# Patient Record
Sex: Male | Born: 1980 | Race: White | Hispanic: Yes | Marital: Married | Smoking: Former smoker
Health system: Southern US, Community
[De-identification: ages and names within clinical notes are randomized; demographics above are authoritative.]

## PROBLEM LIST (undated history)

## (undated) DIAGNOSIS — K922 Gastrointestinal hemorrhage, unspecified: Secondary | ICD-10-CM

## (undated) DIAGNOSIS — K219 Gastro-esophageal reflux disease without esophagitis: Secondary | ICD-10-CM

## (undated) HISTORY — DX: Gastro-esophageal reflux disease without esophagitis: K21.9

## (undated) HISTORY — PX: APPENDECTOMY: SHX54

## (undated) HISTORY — PX: TONSILLECTOMY: SUR1361

---
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
## 2005-10-24 ENCOUNTER — Emergency Department (HOSPITAL_COMMUNITY): Admission: EM | Admit: 2005-10-24 | Discharge: 2005-10-24 | Payer: Self-pay | Admitting: Emergency Medicine

## 2009-11-01 ENCOUNTER — Observation Stay (HOSPITAL_COMMUNITY): Admission: EM | Admit: 2009-11-01 | Discharge: 2009-11-02 | Payer: Self-pay | Admitting: Emergency Medicine

## 2010-07-07 LAB — URINE MICROSCOPIC-ADD ON

## 2010-07-07 LAB — COMPREHENSIVE METABOLIC PANEL
ALT: 23 U/L (ref 0–53)
Albumin: 4.3 g/dL (ref 3.5–5.2)
Calcium: 8.9 mg/dL (ref 8.4–10.5)
Creatinine, Ser: 0.75 mg/dL (ref 0.4–1.5)
GFR calc Af Amer: >60 mL/min (ref >60)
Glucose, Bld: 121 mg/dL — ABNORMAL HIGH (ref 70–99)
Potassium: 3.9 mEq/L (ref 3.5–5.1)
Total Protein: 7.6 g/dL (ref 6.0–8.3)

## 2010-07-07 LAB — CBC
HCT: 43.1 % (ref 39.0–52.0)
MCH: 27.7 pg (ref 26.0–34.0)
MCV: 82.3 fL (ref 78.0–100.0)
RBC: 5.24 MIL/uL (ref 4.22–5.81)
RDW: 13.2 % (ref 11.5–15.5)

## 2010-07-07 LAB — DIFFERENTIAL
Basophils Absolute: 0 10*3/uL (ref 0.0–0.1)
Lymphocytes Relative: 8 % — ABNORMAL LOW (ref 12–46)
Lymphs Abs: 1.1 10*3/uL (ref 0.7–4.0)
Neutro Abs: 12.3 10*3/uL — ABNORMAL HIGH (ref 1.7–7.7)
Neutrophils Relative %: 85 % — ABNORMAL HIGH (ref 43–77)

## 2010-07-07 LAB — URINALYSIS, ROUTINE W REFLEX MICROSCOPIC
Nitrite: NEGATIVE
Specific Gravity, Urine: 1.02 (ref 1.005–1.030)

## 2014-12-10 ENCOUNTER — Encounter (HOSPITAL_COMMUNITY): Payer: Self-pay | Admitting: *Deleted

## 2014-12-10 ENCOUNTER — Emergency Department (HOSPITAL_COMMUNITY): Payer: No Typology Code available for payment source

## 2014-12-10 ENCOUNTER — Emergency Department (HOSPITAL_COMMUNITY)
Admission: EM | Admit: 2014-12-10 | Discharge: 2014-12-10 | Disposition: A | Discharge status: Home / Self Care | Payer: No Typology Code available for payment source | Attending: Emergency Medicine | Admitting: Emergency Medicine

## 2014-12-10 DIAGNOSIS — Y998 Other external cause status: Secondary | ICD-10-CM | POA: Diagnosis not present

## 2014-12-10 DIAGNOSIS — S134XXA Sprain of ligaments of cervical spine, initial encounter: Secondary | ICD-10-CM

## 2014-12-10 DIAGNOSIS — S3991XA Unspecified injury of abdomen, initial encounter: Secondary | ICD-10-CM | POA: Insufficient documentation

## 2014-12-10 DIAGNOSIS — S199XXA Unspecified injury of neck, initial encounter: Secondary | ICD-10-CM | POA: Diagnosis present

## 2014-12-10 DIAGNOSIS — Y92410 Unspecified street and highway as the place of occurrence of the external cause: Secondary | ICD-10-CM | POA: Diagnosis not present

## 2014-12-10 DIAGNOSIS — S3992XA Unspecified injury of lower back, initial encounter: Secondary | ICD-10-CM | POA: Diagnosis not present

## 2014-12-10 DIAGNOSIS — Y9389 Activity, other specified: Secondary | ICD-10-CM | POA: Insufficient documentation

## 2014-12-10 LAB — I-STAT CHEM 8, ED
BUN: 14 mg/dL (ref 6–20)
CALCIUM ION: 1.18 mmol/L (ref 1.12–1.23)
CREATININE: 0.9 mg/dL (ref 0.61–1.24)
Chloride: 103 mmol/L (ref 101–111)
GLUCOSE: 98 mg/dL (ref 65–99)
HCT: 47 % (ref 39.0–52.0)
Hemoglobin: 16 g/dL (ref 13.0–17.0)
Potassium: 4 mmol/L (ref 3.5–5.1)
Sodium: 141 mmol/L (ref 135–145)
TCO2: 25 mmol/L (ref 0–100)

## 2014-12-10 MED ORDER — IBUPROFEN 800 MG PO TABS: 800.0000 mg | ORAL_TABLET | Freq: Four times a day (QID) | ORAL | Status: DC | PRN

## 2014-12-10 MED ORDER — FENTANYL CITRATE (PF) 100 MCG/2ML IJ SOLN: 50.0000 ug | INTRAMUSCULAR | Status: DC | PRN

## 2014-12-10 MED ORDER — IOHEXOL 300 MG/ML  SOLN
100.0000 mL | Freq: Once | INTRAMUSCULAR | Status: AC | PRN
  Administered 2014-12-10: 100 mL via INTRAVENOUS

## 2014-12-10 NOTE — ED Notes (Signed)
Pt alert & oriented x4, stable gait. Patient given discharge instructions, paperwork & prescription(s). Patient  instructed to stop at the registration desk to finish any additional paperwork. Patient verbalized understanding. Pt left department w/ no further questions. 

## 2014-12-10 NOTE — Discharge Instructions (Signed)
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
## 2014-12-10 NOTE — ED Notes (Signed)
Pt asked if wanted any pain medication he denied at this time. Was informed to let this nurse know if pain level increases to the point he wants some.

## 2018-10-20 DIAGNOSIS — B9681 Helicobacter pylori [H. pylori] as the cause of diseases classified elsewhere: Secondary | ICD-10-CM

## 2018-10-20 HISTORY — DX: Gastritis, unspecified, without bleeding: B96.81

## 2018-11-01 ENCOUNTER — Inpatient Hospital Stay (HOSPITAL_COMMUNITY): Payer: Self-pay | Admitting: Anesthesiology

## 2018-11-01 ENCOUNTER — Inpatient Hospital Stay (HOSPITAL_COMMUNITY)
Admission: EM | Admit: 2018-11-01 | Discharge: 2018-11-04 | DRG: 378 | Disposition: A | Discharge status: Home / Self Care | Payer: Self-pay | Attending: Internal Medicine | Admitting: Internal Medicine

## 2018-11-01 ENCOUNTER — Encounter (HOSPITAL_COMMUNITY): Payer: Self-pay | Admitting: *Deleted

## 2018-11-01 ENCOUNTER — Other Ambulatory Visit: Payer: Self-pay

## 2018-11-01 ENCOUNTER — Emergency Department (HOSPITAL_COMMUNITY): Payer: Self-pay

## 2018-11-01 ENCOUNTER — Encounter (HOSPITAL_COMMUNITY)
Admission: EM | Disposition: A | Discharge status: Home / Self Care | Payer: Self-pay | Source: Home / Self Care | Attending: Internal Medicine

## 2018-11-01 DIAGNOSIS — K264 Chronic or unspecified duodenal ulcer with hemorrhage: Principal | ICD-10-CM | POA: Diagnosis present

## 2018-11-01 DIAGNOSIS — R944 Abnormal results of kidney function studies: Secondary | ICD-10-CM | POA: Diagnosis present

## 2018-11-01 DIAGNOSIS — D649 Anemia, unspecified: Secondary | ICD-10-CM | POA: Diagnosis present

## 2018-11-01 DIAGNOSIS — T39395A Adverse effect of other nonsteroidal anti-inflammatory drugs [NSAID], initial encounter: Secondary | ICD-10-CM | POA: Diagnosis present

## 2018-11-01 DIAGNOSIS — K2970 Gastritis, unspecified, without bleeding: Secondary | ICD-10-CM

## 2018-11-01 DIAGNOSIS — K449 Diaphragmatic hernia without obstruction or gangrene: Secondary | ICD-10-CM | POA: Diagnosis present

## 2018-11-01 DIAGNOSIS — K222 Esophageal obstruction: Secondary | ICD-10-CM | POA: Diagnosis present

## 2018-11-01 DIAGNOSIS — M545 Low back pain: Secondary | ICD-10-CM | POA: Diagnosis present

## 2018-11-01 DIAGNOSIS — F1010 Alcohol abuse, uncomplicated: Secondary | ICD-10-CM | POA: Diagnosis present

## 2018-11-01 DIAGNOSIS — Z791 Long term (current) use of non-steroidal anti-inflammatories (NSAID): Secondary | ICD-10-CM

## 2018-11-01 DIAGNOSIS — Z9049 Acquired absence of other specified parts of digestive tract: Secondary | ICD-10-CM

## 2018-11-01 DIAGNOSIS — Z20828 Contact with and (suspected) exposure to other viral communicable diseases: Secondary | ICD-10-CM | POA: Diagnosis present

## 2018-11-01 DIAGNOSIS — R350 Frequency of micturition: Secondary | ICD-10-CM | POA: Diagnosis present

## 2018-11-01 DIAGNOSIS — D519 Vitamin B12 deficiency anemia, unspecified: Secondary | ICD-10-CM | POA: Diagnosis present

## 2018-11-01 DIAGNOSIS — K922 Gastrointestinal hemorrhage, unspecified: Secondary | ICD-10-CM

## 2018-11-01 DIAGNOSIS — E876 Hypokalemia: Secondary | ICD-10-CM | POA: Diagnosis present

## 2018-11-01 DIAGNOSIS — D62 Acute posthemorrhagic anemia: Secondary | ICD-10-CM

## 2018-11-01 DIAGNOSIS — K2971 Gastritis, unspecified, with bleeding: Secondary | ICD-10-CM | POA: Diagnosis present

## 2018-11-01 DIAGNOSIS — B9681 Helicobacter pylori [H. pylori] as the cause of diseases classified elsewhere: Secondary | ICD-10-CM

## 2018-11-01 DIAGNOSIS — K921 Melena: Secondary | ICD-10-CM | POA: Diagnosis present

## 2018-11-01 DIAGNOSIS — R0602 Shortness of breath: Secondary | ICD-10-CM

## 2018-11-01 HISTORY — PX: ESOPHAGOGASTRODUODENOSCOPY (EGD) WITH PROPOFOL: SHX5813

## 2018-11-01 HISTORY — PX: BIOPSY: SHX5522

## 2018-11-01 HISTORY — DX: Gastrointestinal hemorrhage, unspecified: K92.2

## 2018-11-01 LAB — CBC WITH DIFFERENTIAL/PLATELET
Abs Immature Granulocytes: 0.18 10*3/uL — ABNORMAL HIGH (ref 0.00–0.07)
Abs Immature Granulocytes: 0.12 10*3/uL — ABNORMAL HIGH (ref 0.00–0.07)
Basophils Absolute: 0 10*3/uL (ref 0.0–0.1)
Basophils Absolute: 0.1 10*3/uL (ref 0.0–0.1)
Basophils Relative: 0 %
Basophils Relative: 1 %
Eosinophils Absolute: 0 10*3/uL (ref 0.0–0.5)
Eosinophils Absolute: 0.1 10*3/uL (ref 0.0–0.5)
Eosinophils Relative: 0 %
Eosinophils Relative: 1 %
HCT: 18.2 % — ABNORMAL LOW (ref 39.0–52.0)
HCT: 27.2 % — ABNORMAL LOW (ref 39.0–52.0)
Hemoglobin: 5.9 g/dL — CL (ref 13.0–17.0)
Hemoglobin: 8.6 g/dL — ABNORMAL LOW (ref 13.0–17.0)
Immature Granulocytes: 2 %
Immature Granulocytes: 1 %
Lymphocytes Relative: 30 %
Lymphocytes Relative: 25 %
Lymphs Abs: 3.7 10*3/uL (ref 0.7–4.0)
Lymphs Abs: 2.7 10*3/uL (ref 0.7–4.0)
MCH: 27.8 pg (ref 26.0–34.0)
MCH: 28.6 pg (ref 26.0–34.0)
MCHC: 32.4 g/dL (ref 30.0–36.0)
MCHC: 31.6 g/dL (ref 30.0–36.0)
MCV: 85.8 fL (ref 80.0–100.0)
MCV: 90.4 fL (ref 80.0–100.0)
Monocytes Absolute: 0.8 10*3/uL (ref 0.1–1.0)
Monocytes Absolute: 0.7 10*3/uL (ref 0.1–1.0)
Monocytes Relative: 6 %
Monocytes Relative: 7 %
Neutro Abs: 7.7 10*3/uL (ref 1.7–7.7)
Neutro Abs: 7.2 10*3/uL (ref 1.7–7.7)
Neutrophils Relative %: 62 %
Neutrophils Relative %: 65 %
Platelets: 292 10*3/uL (ref 150–400)
Platelets: 247 10*3/uL (ref 150–400)
RBC: 2.12 MIL/uL — ABNORMAL LOW (ref 4.22–5.81)
RBC: 3.01 MIL/uL — ABNORMAL LOW (ref 4.22–5.81)
RDW: 14.3 % (ref 11.5–15.5)
RDW: 16 % — ABNORMAL HIGH (ref 11.5–15.5)
WBC: 12.4 10*3/uL — ABNORMAL HIGH (ref 4.0–10.5)
WBC: 10.9 10*3/uL — ABNORMAL HIGH (ref 4.0–10.5)
nRBC: 1 % — ABNORMAL HIGH (ref 0.0–0.2)
nRBC: 0.7 % — ABNORMAL HIGH (ref 0.0–0.2)

## 2018-11-01 LAB — APTT: aPTT: 30 seconds (ref 24–36)

## 2018-11-01 LAB — SARS CORONAVIRUS 2 BY RT PCR (HOSPITAL ORDER, PERFORMED IN ~~LOC~~ HOSPITAL LAB): SARS Coronavirus 2: NEGATIVE

## 2018-11-01 LAB — IRON AND TIBC
Iron: 77 ug/dL (ref 45–182)
Saturation Ratios: 24 % (ref 17.9–39.5)
TIBC: 316 ug/dL (ref 250–450)
UIBC: 239 ug/dL

## 2018-11-01 LAB — COMPREHENSIVE METABOLIC PANEL
ALT: 40 U/L (ref 0–44)
AST: 21 U/L (ref 15–41)
Albumin: 3.1 g/dL — ABNORMAL LOW (ref 3.5–5.0)
Alkaline Phosphatase: 38 U/L (ref 38–126)
Anion gap: 5 (ref 5–15)
BUN: 41 mg/dL — ABNORMAL HIGH (ref 6–20)
CO2: 23 mmol/L (ref 22–32)
Calcium: 7.3 mg/dL — ABNORMAL LOW (ref 8.9–10.3)
Chloride: 107 mmol/L (ref 98–111)
Creatinine, Ser: 0.73 mg/dL (ref 0.61–1.24)
GFR calc Af Amer: >60 mL/min (ref >60)
GFR calc non Af Amer: >60 mL/min (ref >60)
Glucose, Bld: 157 mg/dL — ABNORMAL HIGH (ref 70–99)
Potassium: 3.3 mmol/L — ABNORMAL LOW (ref 3.5–5.1)
Sodium: 135 mmol/L (ref 135–145)
Total Bilirubin: 0.4 mg/dL (ref 0.3–1.2)
Total Protein: 5.8 g/dL — ABNORMAL LOW (ref 6.5–8.1)

## 2018-11-01 LAB — CBC
HCT: 20.2 % — ABNORMAL LOW (ref 39.0–52.0)
Hemoglobin: 6.5 g/dL — CL (ref 13.0–17.0)
MCH: 28 pg (ref 26.0–34.0)
MCHC: 32.2 g/dL (ref 30.0–36.0)
MCV: 87.1 fL (ref 80.0–100.0)
Platelets: 235 10*3/uL (ref 150–400)
RBC: 2.32 MIL/uL — ABNORMAL LOW (ref 4.22–5.81)
RDW: 14.2 % (ref 11.5–15.5)
WBC: 10.5 10*3/uL (ref 4.0–10.5)
nRBC: 0.9 % — ABNORMAL HIGH (ref 0.0–0.2)

## 2018-11-01 LAB — ABO/RH: ABO/RH(D): O POS

## 2018-11-01 LAB — HEMOGLOBIN AND HEMATOCRIT, BLOOD
HCT: 22.7 % — ABNORMAL LOW (ref 39.0–52.0)
Hemoglobin: 7.3 g/dL — ABNORMAL LOW (ref 13.0–17.0)

## 2018-11-01 LAB — VITAMIN B12: Vitamin B-12: 174 pg/mL — ABNORMAL LOW (ref 180–914)

## 2018-11-01 LAB — PROTIME-INR
INR: 1.2 (ref 0.8–1.2)
Prothrombin Time: 15.1 seconds (ref 11.4–15.2)

## 2018-11-01 LAB — RETICULOCYTES
Immature Retic Fract: 44.2 % — ABNORMAL HIGH (ref 2.3–15.9)
RBC.: 2.1 MIL/uL — ABNORMAL LOW (ref 4.22–5.81)
Retic Count, Absolute: 127.3 10*3/uL (ref 19.0–186.0)
Retic Ct Pct: 6.1 % — ABNORMAL HIGH (ref 0.4–3.1)

## 2018-11-01 LAB — POC OCCULT BLOOD, ED: Fecal Occult Bld: POSITIVE — AB

## 2018-11-01 LAB — PREPARE RBC (CROSSMATCH)

## 2018-11-01 LAB — FERRITIN: Ferritin: 39 ng/mL (ref 24–336)

## 2018-11-01 LAB — FOLATE: Folate: 7.8 ng/mL (ref >5.9)

## 2018-11-01 SURGERY — ESOPHAGOGASTRODUODENOSCOPY (EGD) WITH PROPOFOL
Anesthesia: General

## 2018-11-01 MED ORDER — SODIUM CHLORIDE 0.9 % IV SOLN
INTRAVENOUS | Status: DC
  Administered 2018-11-01 – 2018-11-03 (×3): via INTRAVENOUS

## 2018-11-01 MED ORDER — PROPOFOL 10 MG/ML IV BOLUS
INTRAVENOUS | Status: DC | PRN
  Administered 2018-11-01 (×3): 20 mg via INTRAVENOUS

## 2018-11-01 MED ORDER — SODIUM CHLORIDE 0.9 % IV SOLN: 10.0000 mL/h | Freq: Once | INTRAVENOUS | Status: DC

## 2018-11-01 MED ORDER — HYDROMORPHONE HCL 1 MG/ML IJ SOLN
0.5000 mg | INTRAMUSCULAR | Status: DC | PRN
  Administered 2018-11-01 (×2): 0.5 mg via INTRAVENOUS
  Filled 2018-11-01: qty 0.5

## 2018-11-01 MED ORDER — CYANOCOBALAMIN 1000 MCG/ML IJ SOLN
1000.0000 ug | Freq: Every day | INTRAMUSCULAR | Status: DC
  Administered 2018-11-01 – 2018-11-04 (×4): 1000 ug via INTRAMUSCULAR
  Filled 2018-11-01 (×4): qty 1

## 2018-11-01 MED ORDER — PROPOFOL 10 MG/ML IV BOLUS
INTRAVENOUS | Status: AC
  Filled 2018-11-01: qty 20

## 2018-11-01 MED ORDER — HYDROMORPHONE HCL 1 MG/ML IJ SOLN
INTRAMUSCULAR | Status: AC
  Filled 2018-11-01: qty 0.5

## 2018-11-01 MED ORDER — KETAMINE HCL 10 MG/ML IJ SOLN
INTRAMUSCULAR | Status: DC | PRN
  Administered 2018-11-01 (×2): 10 mg via INTRAVENOUS

## 2018-11-01 MED ORDER — PROPOFOL 500 MG/50ML IV EMUL
INTRAVENOUS | Status: DC | PRN
  Administered 2018-11-01: 150 ug/kg/min via INTRAVENOUS

## 2018-11-01 MED ORDER — LIDOCAINE VISCOUS HCL 2 % MT SOLN
OROMUCOSAL | Status: AC
  Filled 2018-11-01: qty 15

## 2018-11-01 MED ORDER — LIDOCAINE VISCOUS HCL 2 % MT SOLN: 10.0000 mL | Freq: Once | OROMUCOSAL | Status: DC

## 2018-11-01 MED ORDER — ACETAMINOPHEN 325 MG PO TABS
650.0000 mg | ORAL_TABLET | Freq: Four times a day (QID) | ORAL | Status: DC | PRN
  Administered 2018-11-01 – 2018-11-02 (×2): 650 mg via ORAL
  Filled 2018-11-01 (×2): qty 2

## 2018-11-01 MED ORDER — SODIUM CHLORIDE 0.9 % IV BOLUS
1000.0000 mL | Freq: Once | INTRAVENOUS | Status: AC
  Administered 2018-11-01: 1000 mL via INTRAVENOUS

## 2018-11-01 MED ORDER — SODIUM CHLORIDE 0.9 % IV SOLN
8.0000 mg/h | INTRAVENOUS | Status: AC
  Administered 2018-11-01 – 2018-11-04 (×7): 8 mg/h via INTRAVENOUS
  Filled 2018-11-01 (×8): qty 80

## 2018-11-01 MED ORDER — GLYCOPYRROLATE 0.2 MG/ML IJ SOLN
INTRAMUSCULAR | Status: AC
  Filled 2018-11-01: qty 1

## 2018-11-01 MED ORDER — SODIUM CHLORIDE 0.9 % IV SOLN
10.0000 mL/h | Freq: Once | INTRAVENOUS | Status: AC
  Administered 2018-11-01: 10 mL/h via INTRAVENOUS

## 2018-11-01 MED ORDER — SODIUM CHLORIDE (PF) 0.9 % IJ SOLN
PREFILLED_SYRINGE | INTRAMUSCULAR | Status: DC | PRN
  Administered 2018-11-01: 4 mL

## 2018-11-01 MED ORDER — PANTOPRAZOLE SODIUM 40 MG IV SOLR
INTRAVENOUS | Status: AC
  Filled 2018-11-01: qty 160

## 2018-11-01 MED ORDER — LACTATED RINGERS IV SOLN
INTRAVENOUS | Status: DC
  Administered 2018-11-01: 14:00:00 via INTRAVENOUS

## 2018-11-01 MED ORDER — SODIUM CHLORIDE 0.9 % IV SOLN
INTRAVENOUS | Status: DC
  Administered 2018-11-01: 15:00:00 via INTRAVENOUS

## 2018-11-01 MED ORDER — ONDANSETRON HCL 4 MG/2ML IJ SOLN
4.0000 mg | Freq: Once | INTRAMUSCULAR | Status: AC
  Administered 2018-11-01: 4 mg via INTRAVENOUS
  Filled 2018-11-01: qty 2

## 2018-11-01 MED ORDER — PROPOFOL 10 MG/ML IV BOLUS
INTRAVENOUS | Status: AC
  Filled 2018-11-01: qty 40

## 2018-11-01 MED ORDER — LIDOCAINE HCL (CARDIAC) PF 100 MG/5ML IV SOSY
PREFILLED_SYRINGE | INTRAVENOUS | Status: DC | PRN
  Administered 2018-11-01: 50 mg via INTRAVENOUS

## 2018-11-01 MED ORDER — MIDAZOLAM HCL 2 MG/2ML IJ SOLN
INTRAMUSCULAR | Status: AC
  Filled 2018-11-01: qty 2

## 2018-11-01 MED ORDER — SODIUM CHLORIDE 0.9 % IV SOLN
80.0000 mg | Freq: Once | INTRAVENOUS | Status: AC
  Administered 2018-11-01: 80 mg via INTRAVENOUS
  Filled 2018-11-01: qty 80

## 2018-11-01 MED ORDER — KETAMINE HCL 50 MG/5ML IJ SOSY
PREFILLED_SYRINGE | INTRAMUSCULAR | Status: AC
  Filled 2018-11-01: qty 5

## 2018-11-01 MED ORDER — SODIUM CHLORIDE 0.9 % IV SOLN: INTRAVENOUS | Status: DC

## 2018-11-01 MED ORDER — EPINEPHRINE 1 MG/10ML IJ SOSY
PREFILLED_SYRINGE | INTRAMUSCULAR | Status: AC
  Filled 2018-11-01: qty 10

## 2018-11-01 MED ORDER — LIDOCAINE VISCOUS HCL 2 % MT SOLN
OROMUCOSAL | Status: DC | PRN
  Administered 2018-11-01: 10 mL via OROMUCOSAL

## 2018-11-01 MED ORDER — GLYCOPYRROLATE 0.2 MG/ML IJ SOLN
INTRAMUSCULAR | Status: DC | PRN
  Administered 2018-11-01: 0.1 mg via INTRAVENOUS

## 2018-11-01 NOTE — Op Note (Signed)
[facility] Patient Name: Keith Hale Procedure Date: 11/01/2018 1:00 PM MRN: [id number] Date of Birth: July 21, 1980 Attending MD: Jonette EvaSandi Rayona Sardinha MD, MD CSN: [id number] Age: [age] Admit Type: Inpatient Procedure:                Upper GI endoscopy-COLD FORCEPS BIOPSY/SUBMUCOSAL                            INJ/HEMOSPRAY Indications:              Melena ON IBUPROFEN, DRINKS ETOH DAILY, AND NO PPI Providers:                Jonette EvaSandi Noele Icenhour MD, MD, Loma MessingLurae B. Patsy LagerAlbert RN, RN,                            Pandora LeiterNeville David, Technician Referring MD:              Medicines:                Propofol per Anesthesia Complications:            No immediate complications. Estimated Blood Loss:     Estimated blood loss was minimal. Procedure:                Pre-Anesthesia Assessment:                           - Prior to the procedure, a History and Physical                            was performed, and patient medications and                            allergies were reviewed. The patient's tolerance of                            previous anesthesia was also reviewed. The risks                            and benefits of the procedure and the sedation                            options and risks were discussed with the patient.                            All questions were answered, and informed consent                            was obtained. Prior Anticoagulants: The patient has                            taken no previous anticoagulant or antiplatelet                            agents except for NSAID medication. ASA Grade  Assessment: II - A patient with mild systemic                            disease. After reviewing the risks and benefits,                            the patient was deemed in satisfactory condition to                            undergo the procedure. After obtaining informed                            consent, the endoscope was passed under direct         vision. Throughout the procedure, the patient's                            blood pressure, pulse, and oxygen saturations were                            monitored continuously. The GIF-[id number] ([id number]                            scope was introduced through the mouth, and                            advanced to the second part of duodenum. The upper                            GI endoscopy was accomplished without difficulty.                            The patient tolerated the procedure well. Scope In: 2:07:31 PM Scope Out: 2:19:02 PM Total Procedure Duration: 0 hours 11 minutes 31 seconds  Findings:      A widely patent Schatzki ring was found at the gastroesophageal junction.      A medium-sized hiatal hernia was present.      Diffuse mild inflammation characterized by congestion (edema) and       erythema was found in the gastric body and in the gastric antrum.       Biopsies(2:BODY, 1: INCISURA, 2: ANTRUM) were taken with a cold forceps       for Helicobacter pylori testing.      One oozing cratered duodenal ulcer with a visible vessel/ADHERENT CLOT       was found in the duodenal bulb. Area was unsuccessfully injected with 4       mL of a 1:10,000 solution of epinephrine for hemostasis. HEMOSTASIS       ACHIEVED AFTER HEMOSPRAY APPLICATION      The second portion of the duodenum was normal. Impression:               - Widely patent Schatzki ring.                           - Medium-sized hiatal hernia.                           -  MILD Gastritis DUE TO NSAIDs. Biopsied.                           - GI BLEEDI DUE TO Duodenal ulcer with a visible                            vessel. Moderate Sedation:      Per Anesthesia Care Recommendation:           - Return patient to hospital ward for ongoing care.                           - Full liquid diet.                           - Continue present medications. PROTONIX GTT FOR 72                            HRS(STOP JUL 16@1400 ) THEN  TRANSITION TO PROTONIX                            BID.                           - Await pathology results.                           - Return to my office in 4 months. Procedure Code(s):        --- Professional ---                           43255, 59, Esophagogastroduodenoscopy, flexible,                            transoral; with control of bleeding, any method                           43239, Esophagogastroduodenoscopy, flexible,                            transoral; with biopsy, single or multiple Diagnosis Code(s):        --- Professional ---                           K22.2, Esophageal obstruction                           K44.9, Diaphragmatic hernia without obstruction or                            gangrene                           K29.70, Gastritis, unspecified, without bleeding                           K26.4, Chronic or unspecified duodenal ulcer with  hemorrhage                           K92.1, Melena (includes Hematochezia) CPT copyright 2019 [employer]. All rights reserved. The codes documented in this report are preliminary and upon coder review may  be revised to meet current compliance requirements. Barney Drain, MD Barney Drain MD, MD 11/01/2018 2:36:36 PM This report has been signed electronically. Number of Addenda: 0

## 2018-11-01 NOTE — Anesthesia Postprocedure Evaluation (Addendum)
Anesthesia Post Note  Patient: Keith Hale  Procedure(s) Performed: ESOPHAGOGASTRODUODENOSCOPY (EGD) WITH PROPOFOL (N/A ) BIOPSY  Patient location during evaluation: PACU Anesthesia Type: General Level of consciousness: awake and alert Pain management: pain level controlled Vital Signs Assessment: post-procedure vital signs reviewed and stable Respiratory status: spontaneous breathing Cardiovascular status: stable Postop Assessment: no apparent nausea or vomiting Anesthetic complications: no     Last Vitals:  Vitals:   11/01/18 1428 11/01/18 1430  BP: 135/80 129/83  Pulse: 87 85  Resp: (!) 31 (!) 34  Temp: 36.8 C   SpO2: 97% 96%    Last Pain:  Vitals:   11/01/18 1428  TempSrc:   PainSc: 2                  Everette Rank

## 2018-11-01 NOTE — Transfer of Care (Addendum)
Immediate Anesthesia Transfer of Care Note  Patient: Keith Hale  Procedure(s) Performed: ESOPHAGOGASTRODUODENOSCOPY (EGD) WITH PROPOFOL (N/A ) BIOPSY  Patient Location: PACU  Anesthesia Type:General  Level of Consciousness: awake, alert  and patient cooperative  Airway & Oxygen Therapy: Patient Spontanous Breathing  Post-op Assessment: Report given to RN and Post -op Vital signs reviewed and stable  Post vital signs: Reviewed and stable  Last Vitals:  Vitals Value Taken Time  BP 135/80 11/01/18 1428  Temp    Pulse 85 11/01/18 1429  Resp 34 11/01/18 1429  SpO2 96 % 11/01/18 1429  Vitals shown include unvalidated device data.  Last Pain:  Vitals:   11/01/18 1357  TempSrc:   PainSc: 0-No pain         Complications: No apparent anesthesia complications

## 2018-11-01 NOTE — ED Provider Notes (Signed)
[facility] EMERGENCY DEPARTMENT Provider Note   CSN: [id number] Arrival date & time: 11/01/18  0217     History   Chief Complaint Chief Complaint  Patient presents with  . Shortness of Breath    HPI Keith Hale is a 38 y.o. male.     Level 5 caveat for language barrier.  Patient does speak some [place].  Translator used.  Patient states he is felt short of breath with a racing heart and dizziness for the past 2 days.  He denies any chest pain, cough or fever.  States he feels lightheaded especially when he stands up and feels his heart racing and having some trouble breathing.  Admits to having dark stools for the past 6 days is been black in color and firm.  Denies any diarrhea.  No chest pain or abdominal pain. States he uses alcohol occasionally and NSAIDs occasionally.  Not on a regular basis.  No previous history of stomach problems other than appendectomy. No history of ulcers. Denies any chronic medical problems or regular medications.  The history is provided by the patient. The history is limited by a language barrier. A language interpreter was used.  Shortness of Breath Associated symptoms: no rash and no vomiting     History reviewed. No pertinent past medical history.  There are no active problems to display for this patient.   Past Surgical History:  Procedure Laterality Date  . APPENDECTOMY          Home Medications    Prior to Admission medications   Medication Sig Start Date End Date Taking? Authorizing Provider  ibuprofen (ADVIL,MOTRIN) 800 MG tablet Take 1 tablet (800 mg total) by mouth every 6 (six) hours as needed for moderate pain. 12/10/14   Lyndal PulleyKnott, Daniel, MD    Family History No family history on file.  Social History Social History   Tobacco Use  . Smoking status: Never Smoker  . Smokeless tobacco: Never Used  Substance Use Topics  . Alcohol use: Yes    Comment: occ  . Drug use: No     Allergies   Patient has no known  allergies.   Review of Systems Review of Systems  Constitutional: Positive for activity change and fatigue.  HENT: Negative for congestion.   Respiratory: Positive for shortness of breath.   Gastrointestinal: Positive for blood in stool. Negative for nausea and vomiting.  Genitourinary: Negative for dysuria.  Musculoskeletal: Negative for arthralgias, back pain and myalgias.  Skin: Negative for rash.  Neurological: Positive for dizziness, weakness and light-headedness.    all other systems are negative except as noted in the HPI and PMH.    Physical Exam Updated Vital Signs BP (!) 106/58   Pulse (!) 107   Temp 97.8 F (36.6 C) (Oral)   Resp (!) 26   SpO2 100%   Physical Exam Vitals signs and nursing note reviewed.  Constitutional:      General: He is not in acute distress.    Appearance: He is well-developed. He is ill-appearing.  HENT:     Head: Normocephalic and atraumatic.     Mouth/Throat:     Pharynx: No oropharyngeal exudate.  Eyes:     Conjunctiva/sclera: Conjunctivae normal.     Pupils: Pupils are equal, round, and reactive to light.     Comments: Pale conjunctiva  Neck:     Musculoskeletal: Normal range of motion and neck supple.     Comments: No meningismus. Cardiovascular:     Rate  and Rhythm: Regular rhythm. Tachycardia present.     Heart sounds: Normal heart sounds. No murmur.  Pulmonary:     Effort: Pulmonary effort is normal. No respiratory distress.     Breath sounds: Normal breath sounds.  Abdominal:     Palpations: Abdomen is soft.     Tenderness: There is no abdominal tenderness. There is no guarding or rebound.  Genitourinary:    Comments: Black stool on rectal exam, no hemorrhoids Musculoskeletal: Normal range of motion.        General: No tenderness.  Skin:    General: Skin is warm.     Capillary Refill: Capillary refill takes more than 3 seconds.     Coloration: Skin is pale.  Neurological:     General: No focal deficit present.      Mental Status: He is alert and oriented to person, place, and time. Mental status is at baseline.     Cranial Nerves: No cranial nerve deficit.     Motor: No abnormal muscle tone.     Coordination: Coordination normal.     Comments: No ataxia on finger to nose bilaterally. No pronator drift. 5/5 strength throughout. CN 2-12 intact.Equal grip strength. Sensation intact.   Psychiatric:        Behavior: Behavior normal.      ED Treatments / Results  Labs (all labs ordered are listed, but only abnormal results are displayed) Labs Reviewed  COMPREHENSIVE METABOLIC PANEL - Abnormal; Notable for the following components:      Result Value   Potassium 3.3 (*)    Glucose, Bld 157 (*)    BUN 41 (*)    Calcium 7.3 (*)    Total Protein 5.8 (*)    Albumin 3.1 (*)    All other components within normal limits  CBC WITH DIFFERENTIAL/PLATELET - Abnormal; Notable for the following components:   WBC 12.4 (*)    RBC 2.12 (*)    Hemoglobin 5.9 (*)    HCT 18.2 (*)    nRBC 1.0 (*)    Abs Immature Granulocytes 0.18 (*)    All other components within normal limits  POC OCCULT BLOOD, ED - Abnormal; Notable for the following components:   Fecal Occult Bld POSITIVE (*)    All other components within normal limits  SARS CORONAVIRUS 2 (HOSPITAL ORDER, [place])  PROTIME-INR  APTT  VITAMIN B12  FOLATE  IRON AND TIBC  FERRITIN  RETICULOCYTES  TYPE AND SCREEN  PREPARE RBC (CROSSMATCH)  ABO/RH    EKG EKG Interpretation  Date/Time:  Monday November 01 2018 02:28:08 EDT Ventricular Rate:  114 PR Interval:    QRS Duration: 84 QT Interval:  342 QTC Calculation: 471 R Axis:   92 Text Interpretation:  Sinus tachycardia Probable inferior infarct, old No previous ECGs available Confirmed by Ezequiel Essex [phone]) on 11/01/2018 2:49:00 AM   Radiology Dg Chest Port 1 View  Result Date: 11/01/2018 CLINICAL DATA:  Tachycardia and shortness of breath EXAM: PORTABLE CHEST 1  VIEW COMPARISON:  12/10/2014 FINDINGS: The heart size and mediastinal contours are within normal limits. Both lungs are clear. The visualized skeletal structures are unremarkable. IMPRESSION: No active disease. Electronically Signed   By: Donavan Foil M.D.   On: 11/01/2018 03:11    Procedures Procedures (including critical care time)  Medications Ordered in ED Medications  sodium chloride 0.9 % bolus 1,000 mL (has no administration in time range)    And  0.9 %  sodium  chloride infusion (has no administration in time range)  pantoprazole (PROTONIX) 80 mg in sodium chloride 0.9 % 100 mL IVPB (has no administration in time range)  pantoprazole (PROTONIX) 80 mg in sodium chloride 0.9 % 250 mL (0.32 mg/mL) infusion (has no administration in time range)  ondansetron (ZOFRAN) injection 4 mg (has no administration in time range)     Initial Impression / Assessment and Plan / ED Course  I have reviewed the triage vital signs and the nursing notes.  Pertinent labs & imaging results that were available during my care of the patient were reviewed by me and considered in my medical decision making (see chart for details).       Patient with palpitations and shortness of breath for the past several days.  Appears to be secondary to melena patient has on exam.  He was tachycardic and symptomatic with dizziness, shortness of breath and lightheadedness.  IV access established, blood sent for labs.  Start IV fluids and IV PPI.  Patient does agree to blood transfusion if necessary.  Patient improving started on IV fluids and IV PPI.  Blood bank order initiated as transfusion is anticipated.  Hemoglobin is 5.9.  Blood pressure 112/65. Red cell transfusion will be initiated.  Patient remains hemodynamically stable in the ED.  Heart rate has improved to the 100s.  Blood pressure 112/62. Blood transfusion in progress.  Continue IV PPI. Patient will need GI evaluation in the morning for EGD.   Admission to step down unit discussed with Dr. Lucianne MussKumar.  CRITICAL CARE Performed by: Glynn OctaveStephen Ayansh Feutz Total critical care time: 60 minutes Critical care time was exclusive of separately billable procedures and treating other patients. Critical care was necessary to treat or prevent imminent or life-threatening deterioration. Critical care was time spent personally by me on the following activities: development of treatment plan with patient and/or surrogate as well as nursing, discussions with consultants, evaluation of patient's response to treatment, examination of patient, obtaining history from patient or surrogate, ordering and performing treatments and interventions, ordering and review of laboratory studies, ordering and review of radiographic studies, pulse oximetry and re-evaluation of patient's condition.   Final Clinical Impressions(s) / ED Diagnoses   Final diagnoses:  SOB (shortness of breath)  Gastrointestinal hemorrhage with melena    ED Discharge Orders    None       Onnie Alatorre, Jeannett SeniorStephen, MD 11/01/18 0405

## 2018-11-01 NOTE — H&P (Signed)
History and Physical    Keith Hale KZS:[id number] DOB: 1981/03/18 DOA: 11/01/2018  PCP: Patient, No Pcp Per (Confirm with patient/family/NH records and if not entered, this has to be entered at [facility] point of entry) Patient coming from: Home  I have personally briefly reviewed patient's old medical records in [place]  Chief Complaint: Shortness of breath   HPI: Keith Hale is a 38 y.o. male with no known significant past medical history presented to ED for racing heart/palpitaton and exertional SOB.  As per patient he was in his usual state of health 6 days ago when he noticed dark-colored stools.  For the past 2 days patient has been having palpitation with racing heart and exertional shortness of breath with dizziness.  As the symptoms progressively got worse he decided to come to the ED for further evaluation.  At the time of my interview patient endorses to using ibuprofen 4 pills a day and aspirin 2 to 4 pills a day for the past 5 to 10 days for his lower back pain.  No use of alcohol.  No prior history of ulcers, heartburn.  Endorses to remote history of appendectomy. No frank blood per rectum,  No complaint of chest pain, headache, blurring of vision, abdominal pain dysuria and diarrhea.  No complaint of nausea, anosmia, fever, chills.  No sick contacts or travel history.  Has been unemployed.  ED Course: In the ED patient was hemodynamically stable with low normal blood pressure, tachypneic tachycardic heart rates in 90s, saturating 100% on room air afebrile with T-max of 99.1.  Labs with mild leukocytosis 12.4 with hemoglobin 5.9/18.2 with reticulocytosis 44.2 coagulative studies within normal limit, lites with mild hypokalemia 3.3 BUN 41 creatinine 0.73 calcium 7.3.  Iron studies within normal limit.  Chest x-ray unremarkable.  Patient was given 2 units of blood in the ED.  Follow-up labs pending  Review of Systems: As per HPI all other systems reviewed and negative.  History  reviewed. No pertinent past medical history.  Past Surgical History:  Procedure Laterality Date  . APPENDECTOMY       reports that he has never smoked. He has never used smokeless tobacco. He reports current alcohol use. He reports that he does not use drugs.  No Known Allergies  No family history on file.  Acceptable: Family history reviewed and not pertinent (If you reviewed it)  Prior to Admission medications   Medication Sig Start Date End Date Taking? Authorizing Provider  ibuprofen (ADVIL,MOTRIN) 800 MG tablet Take 1 tablet (800 mg total) by mouth every 6 (six) hours as needed for moderate pain. 12/10/14   Leo Grosser, MD    Physical Exam: Vitals:   11/01/18 0309 11/01/18 0313 11/01/18 0330 11/01/18 0349  BP: 112/65  112/62 107/60  Pulse: (!) 106  (!) 101 96  Resp: (!) 25  (!) 22 (!) 23  Temp:  99.1 F (37.3 C)    TempSrc:  Rectal    SpO2: 99%  100% 100%    Constitutional: NAD, calm, comfortable Vitals:   11/01/18 0309 11/01/18 0313 11/01/18 0330 11/01/18 0349  BP: 112/65  112/62 107/60  Pulse: (!) 106  (!) 101 96  Resp: (!) 25  (!) 22 (!) 23  Temp:  99.1 F (37.3 C)    TempSrc:  Rectal    SpO2: 99%  100% 100%    Eyes: PERRL, lids and conjunctival pallor present  ENMT: Mucous membranes are moist. Posterior pharynx clear of any exudate  or lesions.Normal dentition.  Neck: normal, supple, no masses, no thyromegaly Respiratory: clear to auscultation bilaterally, no wheezing, no crackles. Normal respiratory effort. No accessory muscle use.  Cardiovascular: Regular rate and rhythm, no murmurs / rubs / gallops. No extremity edema. 2+ pedal pulses. No carotid bruits.  Abdomen: no tenderness, no masses palpated. No hepatosplenomegaly. Bowel sounds positive.  Musculoskeletal: no clubbing / cyanosis. No joint deformity upper and lower extremities. Good ROM, no contractures. Normal muscle tone.  Skin: no rashes, lesions, ulcers. No induration Neurologic: CN 2-12 grossly  intact. Sensation intact, DTR normal. Strength 5/5 in all 4.  Psychiatric: Normal judgment and insight. Alert and oriented x 3. Normal mood.   Labs on Admission: I have personally reviewed following labs and imaging studies  CBC: Recent Labs  Lab 11/01/18 0248  WBC 12.4*  NEUTROABS 7.7  HGB 5.9*  HCT 18.2*  MCV 85.8  PLT 292   Basic Metabolic Panel: Recent Labs  Lab 11/01/18 0248  NA 135  K 3.3*  CL 107  CO2 23  GLUCOSE 157*  BUN 41*  CREATININE 0.73  CALCIUM 7.3*   GFR: CrCl cannot be calculated (Unknown ideal weight.). Liver Function Tests: Recent Labs  Lab 11/01/18 0248  AST 21  ALT 40  ALKPHOS 38  BILITOT 0.4  PROT 5.8*  ALBUMIN 3.1*   No results for input(s): LIPASE, AMYLASE in the last 168 hours. No results for input(s): AMMONIA in the last 168 hours. Coagulation Profile: Recent Labs  Lab 11/01/18 0248  INR 1.2   Cardiac Enzymes: No results for input(s): CKTOTAL, CKMB, CKMBINDEX, TROPONINI in the last 168 hours. BNP (last 3 results) No results for input(s): PROBNP in the last 8760 hours. HbA1C: No results for input(s): HGBA1C in the last 72 hours. CBG: No results for input(s): GLUCAP in the last 168 hours. Lipid Profile: No results for input(s): CHOL, HDL, LDLCALC, TRIG, CHOLHDL, LDLDIRECT in the last 72 hours. Thyroid Function Tests: No results for input(s): TSH, T4TOTAL, FREET4, T3FREE, THYROIDAB in the last 72 hours. Anemia Panel: No results for input(s): VITAMINB12, FOLATE, FERRITIN, TIBC, IRON, RETICCTPCT in the last 72 hours. Urine analysis:    Component Value Date/Time   COLORURINE YELLOW 11/01/2009 0630   APPEARANCEUR CLEAR 11/01/2009 0630   LABSPEC 1.020 11/01/2009 0630   PHURINE 7.5 11/01/2009 0630   GLUCOSEU NEGATIVE 11/01/2009 0630   HGBUR TRACE (A) 11/01/2009 0630   BILIRUBINUR NEGATIVE 11/01/2009 0630   KETONESUR 15 (A) 11/01/2009 0630   PROTEINUR NEGATIVE 11/01/2009 0630   UROBILINOGEN 0.2 11/01/2009 0630   NITRITE  NEGATIVE 11/01/2009 0630   LEUKOCYTESUR NEGATIVE 11/01/2009 0630    Radiological Exams on Admission: Dg Chest Port 1 View  Result Date: 11/01/2018 CLINICAL DATA:  Tachycardia and shortness of breath EXAM: PORTABLE CHEST 1 VIEW COMPARISON:  12/10/2014 FINDINGS: The heart size and mediastinal contours are within normal limits. Both lungs are clear. The visualized skeletal structures are unremarkable. IMPRESSION: No active disease. Electronically Signed   By: Jasmine PangKim  Fujinaga M.D.   On: 11/01/2018 03:11    EKG: Independently reviewed. Sinus tachycardia with no acute changes.   Assessment/Plan Principal Problem:  GI bleed due to NSAIDs/Melena :  : Presented with exertional dyspnea and palpitations racing heart and dark melanotic stool of 5 days duration was found to have severe anemia with hemoglobin 5.9 with fecal occult blood positive.  Labs with elevated BUN to creatinine ratio.  Patient with possible upper GI bleed.   -We will keep the patient n.p.o. in  anticipation of EGD in the morning -Patient on PPI drip in the ED continue the same -Continue to monitor hemoglobin with every 6 hours CBC -Monitor the patient on telemetry as per the time of presentation patient was sinus tachycardia. -GI consult placed in the epic -Avoid NSAIDs and aspirin in the setting of possible upper GI bleed  Severe anemia : No prior history likely from acute blood loss, iron studies have been normal.  -Patient received 2 units of blood in the ED -We will monitor with CBC every 6 hours.    DVT prophylaxis: SCD Code Status: Full code  Family Communication: By bed side wife  Disposition Plan: Home  Consults called: GI consulted  Admission status: Inpatient   I certify that at the point of admission it is my clinical judgment that the patient will require inpatient hospital care spanning beyond 2 midnights from the point of admission due to high intensity of service, high risk for further deterioration and high  frequency of surveillance required. The following factors support the patient status of inpatient:    Kirstie PeriNavneet Mohanad Carsten MD [facility]  11/01/2018, 4:03 AM

## 2018-11-01 NOTE — Anesthesia Preprocedure Evaluation (Addendum)
Anesthesia Evaluation  Patient identified by MRN, date of birth, ID band Patient awake    Reviewed: Allergy & Precautions, NPO status , reviewed documented beta blocker date and time   Airway        Dental   Pulmonary           Cardiovascular      Neuro/Psych    GI/Hepatic   Endo/Other    Renal/GU      Musculoskeletal   Abdominal   Peds  Hematology   Anesthesia Other Findings   Reproductive/Obstetrics                             Anesthesia Physical Anesthesia Plan  ASA: II and emergent  Anesthesia Plan: General   Post-op Pain Management:    Induction:   PONV Risk Score and Plan: 2 and TIVA  Airway Management Planned:   Additional Equipment:   Intra-op Plan:   Post-operative Plan:   Informed Consent: I have reviewed the patients History and Physical, chart, labs and discussed the procedure including the risks, benefits and alternatives for the proposed anesthesia with the patient or authorized representative who has indicated his/her understanding and acceptance.       Plan Discussed with: CRNA  Anesthesia Plan Comments:        Anesthesia Quick Evaluation

## 2018-11-01 NOTE — H&P (View-Only) (Signed)
Referring Provider: [facility] Primary Care Physician:  Patient, No Pcp Per Primary Gastroenterologist:  Dr. Oneida Alar  Date of Admission: 11/01/18 Date of Consultation: 11/01/18  Reason for Consultation:  Melena, heme positive stool, anemia  HPI:  Keith Hale is a 38 y.o. year old male who presented to the ED with shortness of breath, racing heart, and dizziness for 2 day also with black stools for 6 days.   ED Course: Patient remained hemodynamically stable. HR in the 90s. Heme positive stool. Hemoglobin 5.9, WBC 12.4, INR 1.2, BUN 41, potassium 3.3, iron, TIBC, and ferritin within normal limits. Patient received 1 unit PRBC in ED.  Second unit of blood was just hung prior to me seeing the patient.   Patient states about 6-7 days ago he started having hard black stools. He also started noticing over the last few days his heart was beating fast, was feeling tired, and actually fell at his house yesterday due to feeling dizzy and lightheaded. Denies losing consciousness or hitting his head. States he has never had black stools before. Last BM was Saturday. Prior to 7 days ago, patient had normal formed stools.  No history of PUD. No epigastric pain or other abdominal pain, reflux symptoms, nausea or vomiting. No hematochezia. Patient admits to using ibuprofen about 6 times a week for low back pain. About 2 weeks ago he was taking back and body pain relief powders twice a day for 1 week. Also took 2 aspirin yesterday.  Admits to alcohol use occasionally. Last drank 8 beers on Saturday. States this is about how much he will drink every 2-3 weeks on the weekend. No drug use or tobacco use.   Admits to some chest discomfort when his heart was beating fast, no chest pain at this time. Denies shortness of breath, fever, chills, change in appetite, weight loss. Admits to increase in urinary frequency, denies burning with urination, fowl smell, or hematuria.   No family history of colon cancer  or colon polyps.     Patient does have a language barrier, but he does communicate fairly well without translator.   History reviewed. No pertinent past medical history.  Past Surgical History:  Procedure Laterality Date  . APPENDECTOMY      Prior to Admission medications   Medication Sig Start Date End Date Taking? Authorizing Provider  ibuprofen (ADVIL,MOTRIN) 800 MG tablet Take 1 tablet (800 mg total) by mouth every 6 (six) hours as needed for moderate pain. 12/10/14   Leo Grosser, MD    Current Facility-Administered Medications  Medication Dose Route Frequency Provider Last Rate Last Dose  . 0.9 %  sodium chloride infusion   Intravenous Continuous Oran Rein, MD      . 0.9 %  sodium chloride infusion  10 mL/hr Intravenous Once Oran Rein, MD      . 0.9 %  sodium chloride infusion   Intravenous Continuous Oran Rein, MD 75 mL/hr at 11/01/18 0555    . cyanocobalamin ((VITAMIN B-12)) injection 1,000 mcg  1,000 mcg Intramuscular Daily Barton Dubois, MD      . pantoprazole (PROTONIX) 80 mg in sodium chloride 0.9 % 250 mL (0.32 mg/mL) infusion  8 mg/hr Intravenous Continuous Oran Rein, MD 25 mL/hr at 11/01/18 0347 8 mg/hr at 11/01/18 0347    Allergies as of 11/01/2018  . (No Known Allergies)    No family history on file.  Social History   Socioeconomic History  . Marital status: Married    Spouse name:  Not on file  . Number of children: Not on file  . Years of education: Not on file  . Highest education level: Not on file  Occupational History  . Not on file  Social Needs  . Financial resource strain: Not on file  . Food insecurity    Worry: Not on file    Inability: Not on file  . Transportation needs    Medical: Not on file    Non-medical: Not on file  Tobacco Use  . Smoking status: Never Smoker  . Smokeless tobacco: Never Used  Substance and Sexual Activity  . Alcohol use: Yes    Comment: occ  . Drug use: No  . Sexual activity: Not on file   Lifestyle  . Physical activity    Days per week: Not on file    Minutes per session: Not on file  . Stress: Not on file  Relationships  . Social Musicianconnections    Talks on phone: Not on file    Gets together: Not on file    Attends religious service: Not on file    Active member of club or organization: Not on file    Attends meetings of clubs or organizations: Not on file    Relationship status: Not on file  . Intimate partner violence    Fear of current or ex partner: Not on file    Emotionally abused: Not on file    Physically abused: Not on file    Forced sexual activity: Not on file  Other Topics Concern  . Not on file  Social History Narrative  . Not on file    Review of Systems: Gen: See HPI CV: See HPI Resp: Denies cough GI: See HPI GU : See HPI  MS: Admits to lower back pain, nonradiating.  Derm: Denies rash, itching, dry skin  Psych: Denies depression, anxiety Heme: Denies other bruising, bleeding  Physical Exam: Vital signs in last 24 hours: Temp:  [97.8 F (36.6 C)-99.1 F (37.3 C)] 98.4 F (36.9 C) (07/13 0816) Pulse Rate:  [77-115] 82 (07/13 0816) Resp:  [16-26] 16 (07/13 0816) BP: (103-120)/(58-72) 104/61 (07/13 0816) SpO2:  [95 %-100 %] 100 % (07/13 0816) Weight:  [88.3 kg] 88.3 kg (07/13 0523) Last BM Date: 11/01/18 General:   Alert,  Well-developed, well-nourished, pleasant and cooperative in NAD Head:  Normocephalic and atraumatic. Eyes:  Sclera clear, no icterus.   Conjunctiva somewhat pale. Ears:  Normal auditory acuity. Nose:  No deformity, discharge,  or lesions. Mouth:  No deformity or lesions, dentition normal. Lungs:  Clear throughout to auscultation.   No wheezes, crackles, or rhonchi. No acute distress. Heart:  Regular rate and rhythm; no murmurs, clicks, rubs,  or gallops. Abdomen:  Soft, nontender and nondistended. No masses, hepatosplenomegaly or hernias noted. Normal bowel sounds, without guarding, and without rebound.   Rectal:   Deferred Msk:  Symmetrical without gross deformities. Normal posture. Negative for CVA tenderness.  Pulses:  Normal pulses noted. Extremities:  Without clubbing or edema. Neurologic:  Alert and  oriented x4;  grossly normal neurologically. Skin:  Intact without significant lesions or rashes. Psych:  Alert and cooperative. Normal mood and affect.  Intake/Output from previous day: 07/12 0701 - 07/13 0700 In: 665 [I.V.:250; Blood:315; IV Piggyback:100] Out: -   Lab Results: Recent Labs    11/01/18 0248 11/01/18 0622  WBC 12.4* 10.5  HGB 5.9* 6.5*  HCT 18.2* 20.2*  PLT 292 235   BMET Recent Labs  11/01/18 0248  NA 135  K 3.3*  CL 107  CO2 23  GLUCOSE 157*  BUN 41*  CREATININE 0.73  CALCIUM 7.3*   LFT Recent Labs    11/01/18 0248  PROT 5.8*  ALBUMIN 3.1*  AST 21  ALT 40  ALKPHOS 38  BILITOT 0.4   PT/INR Recent Labs    11/01/18 0248  LABPROT 15.1  INR 1.2   Hepatitis Panel No results for input(s): HEPBSAG, HCVAB, HEPAIGM, HEPBIGM in the last 72 hours. C-Diff No results for input(s): CDIFFTOX in the last 72 hours.  Studies/Results: Dg Chest Port 1 View  Result Date: 11/01/2018 CLINICAL DATA:  Tachycardia and shortness of breath EXAM: PORTABLE CHEST 1 VIEW COMPARISON:  12/10/2014 FINDINGS: The heart size and mediastinal contours are within normal limits. Both lungs are clear. The visualized skeletal structures are unremarkable. IMPRESSION: No active disease. Electronically Signed   By: Jasmine PangKim  Fujinaga M.D.   On: 11/01/2018 03:11    Impression: 38 y.o. male with no significant past medical history who presented to the ED with palpitations, fatigue, dizziness, and melena for the last 6 days in the setting of NSAID use. No blood thinners. Found to have hemoglobin 5.9 and BUN 41 in ED. Stool heme positive. INR 1.2. Iron and ferritin normal. B12 slightly low. B12 repletion has been started by hospitalists. Hemoglobin 6.5 after 1 unit PRBC, hemoglobin responding  normally. Receiving a second unit of PRBC at this time. Patient has been NPO since 5am. Last black stool Saturday. Feeling constipated. No other upper or lower GI symptoms. Suspect upper GI bleed secondary to NSAID use. Patient will need EGD to evaluate for source of bleeding.  Differentials include NSAID induced PUD, gastritis, esophagitis.   Patient also with low back pain and reporting some increase in urinary frequency. No other urinary symptoms. No CVA tenderness on exam. WBC normal today.   Plan: Follow-up on H/H after PRBC administration.  EGD with Dr. Darrick PennaFields. Will discuss timing and anesthesia with Dr. Darrick PennaFields. I have discussed risk and benefits of the procedure with the patient. He stated his understanding and agrees to proceed.   Continue NPO status until timing of EGD is determined.  Continue PPI infusion.  Management of lower back pain and urinary frequency per hospitalists.   LOS: 0 days    11/01/2018, 8:19 AM   Ermalinda MemosKristen Sincerity Cedar, PA-C [facility]

## 2018-11-01 NOTE — Interval H&P Note (Signed)
History and Physical Interval Note:  11/01/2018 2:03 PM  Keith Hale  has presented today for surgery, with the diagnosis of Melena, heme positive stool, acute blood loss anemia.  The various methods of treatment have been discussed with the patient and family. After consideration of risks, benefits and other options for treatment, the patient has consented to  Procedure(s): ESOPHAGOGASTRODUODENOSCOPY (EGD) WITH PROPOFOL (N/A) as a surgical intervention.  The patient's history has been reviewed, patient examined, no change in status, stable for surgery.  I have reviewed the patient's chart and labs.  Questions were answered to the patient's satisfaction.     [employer]

## 2018-11-01 NOTE — Progress Notes (Signed)
Patient seen and examined. Admitted after midnight secondary to acute blood loss anemia in the setting of upper GIB. Stable vital signs currently. Positive FOBT and waiting for EGD hopefully later today. Will continue IV protonix, start B12 repletion (found also to be low) and continue PRBC transfusion as need it. Please refer to H&P written by Dr. Dwyane Dee for further info/details on admission.   Barton Dubois MD [phone]

## 2018-11-01 NOTE — Consult Note (Addendum)
Referring Provider: [facility] Primary Care Physician:  Patient, No Pcp Per Primary Gastroenterologist:  Dr. Oneida Alar  Date of Admission: 11/01/18 Date of Consultation: 11/01/18  Reason for Consultation:  Melena, heme positive stool, anemia  HPI:  Keith Hale is a 38 y.o. year old male who presented to the ED with shortness of breath, racing heart, and dizziness for 2 day also with black stools for 6 days.   ED Course: Patient remained hemodynamically stable. HR in the 90s. Heme positive stool. Hemoglobin 5.9, WBC 12.4, INR 1.2, BUN 41, potassium 3.3, iron, TIBC, and ferritin within normal limits. Patient received 1 unit PRBC in ED.  Second unit of blood was just hung prior to me seeing the patient.   Patient states about 6-7 days ago he started having hard black stools. He also started noticing over the last few days his heart was beating fast, was feeling tired, and actually fell at his house yesterday due to feeling dizzy and lightheaded. Denies losing consciousness or hitting his head. States he has never had black stools before. Last BM was Saturday. Prior to 7 days ago, patient had normal formed stools.  No history of PUD. No epigastric pain or other abdominal pain, reflux symptoms, nausea or vomiting. No hematochezia. Patient admits to using ibuprofen about 6 times a week for low back pain. About 2 weeks ago he was taking back and body pain relief powders twice a day for 1 week. Also took 2 aspirin yesterday.  Admits to alcohol use occasionally. Last drank 8 beers on Saturday. States this is about how much he will drink every 2-3 weeks on the weekend. No drug use or tobacco use.   Admits to some chest discomfort when his heart was beating fast, no chest pain at this time. Denies shortness of breath, fever, chills, change in appetite, weight loss. Admits to increase in urinary frequency, denies burning with urination, fowl smell, or hematuria.   No family history of colon cancer  or colon polyps.     Patient does have a language barrier, but he does communicate fairly well without translator.   History reviewed. No pertinent past medical history.  Past Surgical History:  Procedure Laterality Date  . APPENDECTOMY      Prior to Admission medications   Medication Sig Start Date End Date Taking? Authorizing Provider  ibuprofen (ADVIL,MOTRIN) 800 MG tablet Take 1 tablet (800 mg total) by mouth every 6 (six) hours as needed for moderate pain. 12/10/14   Leo Grosser, MD    Current Facility-Administered Medications  Medication Dose Route Frequency Provider Last Rate Last Dose  . 0.9 %  sodium chloride infusion   Intravenous Continuous Oran Rein, MD      . 0.9 %  sodium chloride infusion  10 mL/hr Intravenous Once Oran Rein, MD      . 0.9 %  sodium chloride infusion   Intravenous Continuous Oran Rein, MD 75 mL/hr at 11/01/18 0555    . cyanocobalamin ((VITAMIN B-12)) injection 1,000 mcg  1,000 mcg Intramuscular Daily Barton Dubois, MD      . pantoprazole (PROTONIX) 80 mg in sodium chloride 0.9 % 250 mL (0.32 mg/mL) infusion  8 mg/hr Intravenous Continuous Oran Rein, MD 25 mL/hr at 11/01/18 0347 8 mg/hr at 11/01/18 0347    Allergies as of 11/01/2018  . (No Known Allergies)    No family history on file.  Social History   Socioeconomic History  . Marital status: Married    Spouse name:  Not on file  . Number of children: Not on file  . Years of education: Not on file  . Highest education level: Not on file  Occupational History  . Not on file  Social Needs  . Financial resource strain: Not on file  . Food insecurity    Worry: Not on file    Inability: Not on file  . Transportation needs    Medical: Not on file    Non-medical: Not on file  Tobacco Use  . Smoking status: Never Smoker  . Smokeless tobacco: Never Used  Substance and Sexual Activity  . Alcohol use: Yes    Comment: occ  . Drug use: No  . Sexual activity: Not on file   Lifestyle  . Physical activity    Days per week: Not on file    Minutes per session: Not on file  . Stress: Not on file  Relationships  . Social Musicianconnections    Talks on phone: Not on file    Gets together: Not on file    Attends religious service: Not on file    Active member of club or organization: Not on file    Attends meetings of clubs or organizations: Not on file    Relationship status: Not on file  . Intimate partner violence    Fear of current or ex partner: Not on file    Emotionally abused: Not on file    Physically abused: Not on file    Forced sexual activity: Not on file  Other Topics Concern  . Not on file  Social History Narrative  . Not on file    Review of Systems: Gen: See HPI CV: See HPI Resp: Denies cough GI: See HPI GU : See HPI  MS: Admits to lower back pain, nonradiating.  Derm: Denies rash, itching, dry skin  Psych: Denies depression, anxiety Heme: Denies other bruising, bleeding  Physical Exam: Vital signs in last 24 hours: Temp:  [97.8 F (36.6 C)-99.1 F (37.3 C)] 98.4 F (36.9 C) (07/13 0816) Pulse Rate:  [77-115] 82 (07/13 0816) Resp:  [16-26] 16 (07/13 0816) BP: (103-120)/(58-72) 104/61 (07/13 0816) SpO2:  [95 %-100 %] 100 % (07/13 0816) Weight:  [88.3 kg] 88.3 kg (07/13 0523) Last BM Date: 11/01/18 General:   Alert,  Well-developed, well-nourished, pleasant and cooperative in NAD Head:  Normocephalic and atraumatic. Eyes:  Sclera clear, no icterus.   Conjunctiva somewhat pale. Ears:  Normal auditory acuity. Nose:  No deformity, discharge,  or lesions. Mouth:  No deformity or lesions, dentition normal. Lungs:  Clear throughout to auscultation.   No wheezes, crackles, or rhonchi. No acute distress. Heart:  Regular rate and rhythm; no murmurs, clicks, rubs,  or gallops. Abdomen:  Soft, nontender and nondistended. No masses, hepatosplenomegaly or hernias noted. Normal bowel sounds, without guarding, and without rebound.   Rectal:   Deferred Msk:  Symmetrical without gross deformities. Normal posture. Negative for CVA tenderness.  Pulses:  Normal pulses noted. Extremities:  Without clubbing or edema. Neurologic:  Alert and  oriented x4;  grossly normal neurologically. Skin:  Intact without significant lesions or rashes. Psych:  Alert and cooperative. Normal mood and affect.  Intake/Output from previous day: 07/12 0701 - 07/13 0700 In: 665 [I.V.:250; Blood:315; IV Piggyback:100] Out: -   Lab Results: Recent Labs    11/01/18 0248 11/01/18 0622  WBC 12.4* 10.5  HGB 5.9* 6.5*  HCT 18.2* 20.2*  PLT 292 235   BMET Recent Labs  11/01/18 0248  NA 135  K 3.3*  CL 107  CO2 23  GLUCOSE 157*  BUN 41*  CREATININE 0.73  CALCIUM 7.3*   LFT Recent Labs    11/01/18 0248  PROT 5.8*  ALBUMIN 3.1*  AST 21  ALT 40  ALKPHOS 38  BILITOT 0.4   PT/INR Recent Labs    11/01/18 0248  LABPROT 15.1  INR 1.2   Hepatitis Panel No results for input(s): HEPBSAG, HCVAB, HEPAIGM, HEPBIGM in the last 72 hours. C-Diff No results for input(s): CDIFFTOX in the last 72 hours.  Studies/Results: Dg Chest Port 1 View  Result Date: 11/01/2018 CLINICAL DATA:  Tachycardia and shortness of breath EXAM: PORTABLE CHEST 1 VIEW COMPARISON:  12/10/2014 FINDINGS: The heart size and mediastinal contours are within normal limits. Both lungs are clear. The visualized skeletal structures are unremarkable. IMPRESSION: No active disease. Electronically Signed   By: Kim  Fujinaga M.D.   On: 11/01/2018 03:11    Impression: 37 y.o. male with no significant past medical history who presented to the ED with palpitations, fatigue, dizziness, and melena for the last 6 days in the setting of NSAID use. No blood thinners. Found to have hemoglobin 5.9 and BUN 41 in ED. Stool heme positive. INR 1.2. Iron and ferritin normal. B12 slightly low. B12 repletion has been started by hospitalists. Hemoglobin 6.5 after 1 unit PRBC, hemoglobin responding  normally. Receiving a second unit of PRBC at this time. Patient has been NPO since 5am. Last black stool Saturday. Feeling constipated. No other upper or lower GI symptoms. Suspect upper GI bleed secondary to NSAID use. Patient will need EGD to evaluate for source of bleeding.  Differentials include NSAID induced PUD, gastritis, esophagitis.   Patient also with low back pain and reporting some increase in urinary frequency. No other urinary symptoms. No CVA tenderness on exam. WBC normal today.   Plan: Follow-up on H/H after PRBC administration.  EGD with Dr. Fields. Will discuss timing and anesthesia with Dr. Fields. I have discussed risk and benefits of the procedure with the patient. He stated his understanding and agrees to proceed.   Continue NPO status until timing of EGD is determined.  Continue PPI infusion.  Management of lower back pain and urinary frequency per hospitalists.   LOS: 0 days    11/01/2018, 8:19 AM   Rondalyn Belford, PA-C [facility]  

## 2018-11-01 NOTE — Progress Notes (Addendum)
RN made Dr.Fields aware of results of H&H HgB 7.3/ Hct- 22.7. No new orders given.

## 2018-11-01 NOTE — ED Notes (Signed)
Date and time results received: 11/01/18 @03 :13   Critical Value: HGB 5.9  Name of Provider Notified: Dr Wyvonnia Dusky  Orders Received? Or Actions Taken?: none

## 2018-11-01 NOTE — Plan of Care (Signed)

## 2018-11-01 NOTE — ED Triage Notes (Signed)
Pt c/o sob, tachycardia that started yesterday, dark stools that started 5 days ago,

## 2018-11-01 NOTE — Progress Notes (Signed)
Patient returned from endoscopy via stretcher. VSS, drowsy but easily aroused and oriented x4. Denies any pain at present. Chest CTA, abd soft with active bowel sounds, non-tender. IVF infusing per order without s/s infiltration.

## 2018-11-02 DIAGNOSIS — D519 Vitamin B12 deficiency anemia, unspecified: Secondary | ICD-10-CM

## 2018-11-02 DIAGNOSIS — K921 Melena: Secondary | ICD-10-CM

## 2018-11-02 DIAGNOSIS — Z720 Tobacco use: Secondary | ICD-10-CM

## 2018-11-02 DIAGNOSIS — D62 Acute posthemorrhagic anemia: Secondary | ICD-10-CM

## 2018-11-02 LAB — HEMOGLOBIN AND HEMATOCRIT, BLOOD
HCT: 23.8 % — ABNORMAL LOW (ref 39.0–52.0)
Hemoglobin: 7.8 g/dL — ABNORMAL LOW (ref 13.0–17.0)

## 2018-11-02 LAB — CBC
HCT: 21.3 % — ABNORMAL LOW (ref 39.0–52.0)
Hemoglobin: 6.8 g/dL — CL (ref 13.0–17.0)
MCH: 28.8 pg (ref 26.0–34.0)
MCHC: 31.9 g/dL (ref 30.0–36.0)
MCV: 90.3 fL (ref 80.0–100.0)
Platelets: 225 10*3/uL (ref 150–400)
RBC: 2.36 MIL/uL — ABNORMAL LOW (ref 4.22–5.81)
RDW: 16 % — ABNORMAL HIGH (ref 11.5–15.5)
WBC: 10.2 10*3/uL (ref 4.0–10.5)
nRBC: 0.6 % — ABNORMAL HIGH (ref 0.0–0.2)

## 2018-11-02 LAB — PREPARE RBC (CROSSMATCH)

## 2018-11-02 LAB — HIV ANTIBODY (ROUTINE TESTING W REFLEX): HIV Screen 4th Generation wRfx: NONREACTIVE

## 2018-11-02 MED ORDER — POTASSIUM CHLORIDE CRYS ER 20 MEQ PO TBCR
40.0000 meq | EXTENDED_RELEASE_TABLET | Freq: Once | ORAL | Status: AC
  Administered 2018-11-02: 40 meq via ORAL
  Filled 2018-11-02: qty 2

## 2018-11-02 MED ORDER — SODIUM CHLORIDE 0.9% IV SOLUTION: Freq: Once | INTRAVENOUS | Status: DC

## 2018-11-02 MED ORDER — SODIUM CHLORIDE 0.9 % IV BOLUS: 1000.0000 mL | Freq: Once | INTRAVENOUS | Status: DC

## 2018-11-02 NOTE — Progress Notes (Signed)
CRITICAL VALUE ALERT  Critical Value: hemoglobin 6.8   Date & Time Notied: 11-02-2018 3254  Provider Notified: [id number], Dr. Olevia Bowens  Orders Received/Actions taken  Received orders for blood transfuion.

## 2018-11-02 NOTE — Progress Notes (Signed)
PROGRESS NOTE    Keith Hale  FBP:[id number] DOB: 1981-01-18 DOA: 11/01/2018 PCP: Patient, No Pcp Per     Brief Narrative:  38 y.o. male with no known significant past medical history presented to ED for racing heart/palpitaton and exertional SOB.  As per patient he was in his usual state of health 6 days ago when he noticed dark-colored stools.  For the past 2 days patient has been having palpitation with racing heart and exertional shortness of breath with dizziness.  As the symptoms progressively got worse he decided to come to the ED for further evaluation.  At the time of my interview patient endorses to using ibuprofen 4 pills a day and aspirin 2 to 4 pills a day for the past 5 to 10 days for his lower back pain.  No use of alcohol.  No prior history of ulcers, heartburn.  Endorses to remote history of appendectomy. No frank blood per rectum,  No complaint of chest pain, headache, blurring of vision, abdominal pain dysuria and diarrhea.  No complaint of nausea, anosmia, fever, chills.  No sick contacts or travel history.  Has been unemployed.   Assessment & Plan: 1-acute blood loss anemia in the setting of upper GI bleed secondary to NSAIDs -Duodenal ulcer appreciated on endoscopy with visible vessel -Hemostasis achieved with epinephrine injection and HemoSplit application -Patient has required the need of 3 units of PRBCs last 1 given today 11/02/2018 due to hemoglobin of 6.8 during AM labs. -Repeat H&H with a hemoglobin of 7.8. -Patient reports no further episodes of melanotic stools; but has not had a bowel movement since admission -No vomiting/no hematemesis/no nausea -Clear liquid diet has been recommended by GI service. -Continue monitoring hemoglobin trend -Continue IV Protonix.  2-tobacco abuse: -Cessation counseling provided -Patient looking to quit -Declined the use of nicotine  3-B 12 deficiency anemia -Started on B12 supplementation (anticipated schedule of repletion  following 1000 mcg daily for 1 week, weekly for 1 month and then monthly after that) -Repeat B12 level in 3 months.  4-alcohol abuse -Socially -Patient reports no to be a problem and has not experienced withdrawal -Cessation counseling has been provided.  5-hypokalemia -Repleted -Check BMP and magnesium level in a.m.   DVT prophylaxis: SCDs Code Status: Full code Family Communication: No family at bedside. Disposition Plan: Clear liquid diet, follow hemoglobin trend and transfuse as needed; continue Protonix drip for 72 hours (anticipated last day of infusion 11/04/2018), followed by PPI twice a day.  Patient has been instructed to avoid the use of tobacco alcohol and NSAIDs.  Follow GI diet further recommendations.  Consultants:   Gastroenterology service  Procedures:   Endoscopy: Widely patent Schatzki ring, medium size hiatal hernia, mild gastritis due to NSAIDs; GI bleed due to duodenal ulcer with visible vessel.  Patient successfully treated with epinephrine injection and hemo-spray application.  Antimicrobials:  Anti-infectives (From admission, onward)   None       Subjective: Afebrile, no chest pain, no shortness of breath.  Reports feeling weak and tired.  No further bowel movement.  No abdominal pain currently.  Objective: Vitals:   11/01/18 2123 11/02/18 0536 11/02/18 0642 11/02/18 0658  BP: 103/64 (!) 85/44 112/60 (!) 101/55  Pulse: (!) 107 78 80 78  Resp: 16 16 16 16   Temp: (!) 100.4 F (38 C) 98.8 F (37.1 C) 98.7 F (37.1 C) 98.6 F (37 C)  TempSrc: Oral Oral Oral Oral  SpO2: 98% 99% 98% 100%  Weight:  Height:        Intake/Output Summary (Last 24 hours) at 11/02/2018 1735 Last data filed at 11/02/2018 1300 Gross per 24 hour  Intake 1480 ml  Output -  Net 1480 ml   Filed Weights   11/01/18 0523  Weight: 88.3 kg    Examination: General exam: Alert, awake, oriented x 3; denies chest pain, no shortness of breath, no lightheadedness.   Feeling weak and tired.  Reports no further bowel movements. Respiratory system: Clear to auscultation. Respiratory effort normal. Cardiovascular system:RRR. No murmurs, rubs, gallops. Gastrointestinal system: Abdomen is nondistended, soft and nontender. No organomegaly or masses felt. Normal bowel sounds heard. Central nervous system: Alert and oriented. No focal neurological deficits. Extremities: No C/C/E, +pedal pulses Skin: No rashes, lesions or ulcers Psychiatry: Judgement and insight appear normal. Mood & affect appropriate.     Data Reviewed: I have personally reviewed following labs and imaging studies  CBC: Recent Labs  Lab 11/01/18 0248 11/01/18 0622 11/01/18 1443 11/01/18 1806 11/02/18 0439 11/02/18 1532  WBC 12.4* 10.5  --  10.9* 10.2  --   NEUTROABS 7.7  --   --  7.2  --   --   HGB 5.9* 6.5* 7.3* 8.6* 6.8* 7.8*  HCT 18.2* 20.2* 22.7* 27.2* 21.3* 23.8*  MCV 85.8 87.1  --  90.4 90.3  --   PLT 292 235  --  247 225  --    Basic Metabolic Panel: Recent Labs  Lab 11/01/18 0248  NA 135  K 3.3*  CL 107  CO2 23  GLUCOSE 157*  BUN 41*  CREATININE 0.73  CALCIUM 7.3*   GFR: Estimated Creatinine Clearance: 138.9 mL/min (by C-G formula based on SCr of 0.73 mg/dL).   Liver Function Tests: Recent Labs  Lab 11/01/18 0248  AST 21  ALT 40  ALKPHOS 38  BILITOT 0.4  PROT 5.8*  ALBUMIN 3.1*   Coagulation Profile: Recent Labs  Lab 11/01/18 0248  INR 1.2    Anemia Panel: Recent Labs    11/01/18 0248  VITAMINB12 174*  FOLATE 7.8  FERRITIN 39  TIBC 316  IRON 77  RETICCTPCT 6.1*   Urine analysis:    Component Value Date/Time   COLORURINE YELLOW 11/01/2009 0630   APPEARANCEUR CLEAR 11/01/2009 0630   LABSPEC 1.020 11/01/2009 0630   PHURINE 7.5 11/01/2009 0630   GLUCOSEU NEGATIVE 11/01/2009 0630   HGBUR TRACE (A) 11/01/2009 0630   BILIRUBINUR NEGATIVE 11/01/2009 0630   KETONESUR 15 (A) 11/01/2009 0630   PROTEINUR NEGATIVE 11/01/2009 0630    UROBILINOGEN 0.2 11/01/2009 0630   NITRITE NEGATIVE 11/01/2009 0630   LEUKOCYTESUR NEGATIVE 11/01/2009 0630    Recent Results (from the past 240 hour(s))  SARS Coronavirus 2 (CEPHEID - Performed in [facility] hospital lab), Hosp Order     Status: None   Collection Time: 11/01/18  3:04 AM   Specimen: Nasopharyngeal Swab  Result Value Ref Range Status   SARS Coronavirus 2 NEGATIVE NEGATIVE Final    Comment: (NOTE) If result is NEGATIVE SARS-CoV-2 target nucleic acids are NOT DETECTED. The SARS-CoV-2 RNA is generally detectable in upper and lower  respiratory specimens during the acute phase of infection. The lowest  concentration of SARS-CoV-2 viral copies this assay can detect is 250  copies / mL. A negative result does not preclude SARS-CoV-2 infection  and should not be used as the sole basis for treatment or other  patient management decisions.  A negative result may occur with  improper specimen collection /  handling, submission of specimen other  than nasopharyngeal swab, presence of viral mutation(s) within the  areas targeted by this assay, and inadequate number of viral copies  (<250 copies / mL). A negative result must be combined with clinical  observations, patient history, and epidemiological information. If result is POSITIVE SARS-CoV-2 target nucleic acids are DETECTED. The SARS-CoV-2 RNA is generally detectable in upper and lower  respiratory specimens dur ing the acute phase of infection.  Positive  results are indicative of active infection with SARS-CoV-2.  Clinical  correlation with patient history and other diagnostic information is  necessary to determine patient infection status.  Positive results do  not rule out bacterial infection or co-infection with other viruses. If result is PRESUMPTIVE POSTIVE SARS-CoV-2 nucleic acids MAY BE PRESENT.   A presumptive positive result was obtained on the submitted specimen  and confirmed on repeat testing.  While 2019  novel coronavirus  (SARS-CoV-2) nucleic acids may be present in the submitted sample  additional confirmatory testing may be necessary for epidemiological  and / or clinical management purposes  to differentiate between  SARS-CoV-2 and other Sarbecovirus currently known to infect humans.  If clinically indicated additional testing with an alternate test  methodology [phone] is advised. The SARS-CoV-2 RNA is generally  detectable in upper and lower respiratory sp ecimens during the acute  phase of infection. The expected result is Negative. Fact Sheet for Patients:  BoilerBrush.com.cyhttps://www.fda.gov/media/136312/download Fact Sheet for Healthcare Providers: https://pope.com/https://www.fda.gov/media/136313/download This test is not yet approved or cleared by the [place] FDA and has been authorized for detection and/or diagnosis of SARS-CoV-2 by FDA under an Emergency Use Authorization (EUA).  This EUA will remain in effect (meaning this test can be used) for the duration of the COVID-19 declaration under Section 564(b)(1) of the Act, 21 U.S.C. section 360bbb-3(b)(1), unless the authorization is terminated or revoked sooner. Performed at [facility], [address] [place], [place] [address]      Radiology Studies: [facility]  Result Date: 11/01/2018 CLINICAL DATA:  Tachycardia and shortness of breath EXAM: PORTABLE CHEST 1 VIEW COMPARISON:  12/10/2014 FINDINGS: The heart size and mediastinal contours are within normal limits. Both lungs are clear. The visualized skeletal structures are unremarkable. IMPRESSION: No active disease. Electronically Signed   By: Jasmine PangKim  Fujinaga M.D.   On: 11/01/2018 03:11    Scheduled Meds: . sodium chloride   Intravenous Once  . cyanocobalamin  1,000 mcg Intramuscular Daily  . lidocaine  10 mL Mouth/Throat Once   Continuous Infusions: . sodium chloride    . sodium chloride Stopped (11/01/18 1239)  . pantoprozole (PROTONIX) infusion 8 mg/hr (11/02/18 1151)  .  sodium chloride       LOS: 1 day    Time spent: 30 minutes    Vassie Lollarlos Kristinia Leavy, MD [facility] Pager [phone]   11/02/2018, 5:35 PM

## 2018-11-02 NOTE — Progress Notes (Addendum)
Subjective: Patient eating breakfast when I entered the room. Patient states he feels some better. Has headache yesterday. Reports nurse gave him something for it and it went away. Headache returned this morning. Still with no BM. Patient states he doesn't feel constipated. No abdominal pain, nausea, or vomiting. Palpitations yesterday when getting up to use the bathroom, but none today. No lightheadedness or dizziness.   Patient called his wife while I was in the room so she would know what was going on.    Objective: Vital signs in last 24 hours: Temp:  [98.3 F (36.8 C)-100.4 F (38 C)] 98.6 F (37 C) (07/14 0658) Pulse Rate:  [71-107] 78 (07/14 0658) Resp:  [16-34] 16 (07/14 0658) BP: (85-135)/(44-83) 101/55 (07/14 0658) SpO2:  [94 %-100 %] 100 % (07/14 0658) Last BM Date: 10/31/18 General:   Alert and oriented, pleasant Head:  Normocephalic and atraumatic. Eyes:  No icterus, sclera clear. Conjuctiva pink.  Mouth:  Without lesions, mucosa pink and moist.  Neck:  Supple, without thyromegaly or masses.  Heart:  S1, S2 present, no murmurs noted.  Lungs: Clear to auscultation bilaterally, without wheezing, rales, or rhonchi.  Abdomen:  Bowel sounds present, soft, non-tender, non-distended. No HSM or hernias noted. No rebound or guarding. No masses appreciated  Msk:  Symmetrical without gross deformities. Normal posture. Pulses:  Normal pulses noted. Extremities:  Without clubbing or edema. Neurologic:  Alert and  oriented x4;  grossly normal neurologically. Skin:  Warm and dry, intact without significant lesions.  Cervical Nodes:  No significant cervical adenopathy. Psych:  Alert and cooperative. Normal mood and affect.  Intake/Output from previous day: 07/13 0701 - 07/14 0700 In: 2536.9 [P.O.:120; I.V.:1441.9; Blood:975] Out: -  Intake/Output this shift: No intake/output data recorded.  Lab Results: Recent Labs    11/01/18 0622 11/01/18 1443 11/01/18 1806  11/02/18 0439  WBC 10.5  --  10.9* 10.2  HGB 6.5* 7.3* 8.6* 6.8*  HCT 20.2* 22.7* 27.2* 21.3*  PLT 235  --  247 225   BMET Recent Labs    11/01/18 0248  NA 135  K 3.3*  CL 107  CO2 23  GLUCOSE 157*  BUN 41*  CREATININE 0.73  CALCIUM 7.3*   LFT Recent Labs    11/01/18 0248  PROT 5.8*  ALBUMIN 3.1*  AST 21  ALT 40  ALKPHOS 38  BILITOT 0.4   PT/INR Recent Labs    11/01/18 0248  LABPROT 15.1  INR 1.2    Studies/Results: Dg Chest Port 1 View  Result Date: 11/01/2018 CLINICAL DATA:  Tachycardia and shortness of breath EXAM: PORTABLE CHEST 1 VIEW COMPARISON:  12/10/2014 FINDINGS: The heart size and mediastinal contours are within normal limits. Both lungs are clear. The visualized skeletal structures are unremarkable. IMPRESSION: No active disease. Electronically Signed   By: Donavan Foil M.D.   On: 11/01/2018 03:11    Assessment: 38 y.o. male with no significant past medical history who presented to the ED on 7/13 with palpitations, fatigue, dizziness, and melena for the last 6 days in the setting of NSAID use. No other upper or lower GI symptoms. Hemoglobin 5.9 and BUN 41 in ED. Iron and ferritin normal. B12 slightly low. B12 repletion has been started by hospitalists.  Patient received 2 units of PRBCs. EGD on 7/13 with GI bleed due to duodenal ulcer with visible vessel. Unsuccessfully injected with 56mL of 1:10,000 epinephrine for hemostasis. Hemostasis achieved after hemospray application. Mild gastritis due to NSAIDs. Biopsied. Hemoglobin  8.6 on evening of 7/13. Hemoglobin down to 6.8 this morning, 7/14. 1 unit PRBCs ordered. Transfusion has started. BP was 88/55 around 5:30am. Normal saline bolus was given. BP has improved. No tachycardia. Drop in hemoglobin secondary to equilibration following EGD with hemostasis achieved and possible hemodilution effect vs overt GI bleeding. Patient has not had no BM since admission. No vomiting. Will make patient NPO, discuss with  Dr. Darrick PennaFields, and continue to follow hemoglobin.     Plan: NPO at this time. Will discuss with Dr. Darrick PennaFields whether this needs to be continued or if patient is ok to resume full liquids.  Follow H/H Continue PPI infusion until July 16 at 1400. Then switch to Protonix 40 mg BID.  Transfuse as necessary Follow-up on gastric biopsies Patient needs to follow-up in GI office in 4 months.    LOS: 1 day    11/02/2018, 7:56 AM   Ermalinda MemosKristen Ailey Wessling, PA-C [facility]

## 2018-11-02 NOTE — Addendum Note (Signed)
Addendum  created 11/02/18 0753 by Mickel Baas, CRNA   Charge Capture section accepted

## 2018-11-02 NOTE — Progress Notes (Signed)
Normal saline bolus given per MD order (due to low bp 85 systolic).  Bp increased to WNL.  One unit of blood started per MD order.  Will continue to monitor patient.

## 2018-11-03 ENCOUNTER — Encounter (HOSPITAL_COMMUNITY): Payer: Self-pay | Admitting: Gastroenterology

## 2018-11-03 LAB — TYPE AND SCREEN
ABO/RH(D): O POS
Antibody Screen: NEGATIVE
Unit division: 0
Unit division: 0
Unit division: 0

## 2018-11-03 LAB — CBC
HCT: 25.6 % — ABNORMAL LOW (ref 39.0–52.0)
Hemoglobin: 8.2 g/dL — ABNORMAL LOW (ref 13.0–17.0)
MCH: 29 pg (ref 26.0–34.0)
MCHC: 32 g/dL (ref 30.0–36.0)
MCV: 90.5 fL (ref 80.0–100.0)
Platelets: 251 10*3/uL (ref 150–400)
RBC: 2.83 MIL/uL — ABNORMAL LOW (ref 4.22–5.81)
RDW: 16 % — ABNORMAL HIGH (ref 11.5–15.5)
WBC: 6.9 10*3/uL (ref 4.0–10.5)
nRBC: 0 % (ref 0.0–0.2)

## 2018-11-03 LAB — BPAM RBC
Blood Product Expiration Date: 202008112359
Blood Product Expiration Date: 202008132359
Blood Product Expiration Date: 202008132359
ISSUE DATE / TIME: 202007130400
ISSUE DATE / TIME: 202007130828
ISSUE DATE / TIME: 202007140636
Unit Type and Rh: 5100
Unit Type and Rh: 5100
Unit Type and Rh: 5100

## 2018-11-03 LAB — BASIC METABOLIC PANEL
Anion gap: 6 (ref 5–15)
BUN: 6 mg/dL (ref 6–20)
CO2: 26 mmol/L (ref 22–32)
Calcium: 8.1 mg/dL — ABNORMAL LOW (ref 8.9–10.3)
Chloride: 109 mmol/L (ref 98–111)
Creatinine, Ser: 0.68 mg/dL (ref 0.61–1.24)
GFR calc Af Amer: >60 mL/min (ref >60)
GFR calc non Af Amer: >60 mL/min (ref >60)
Glucose, Bld: 97 mg/dL (ref 70–99)
Potassium: 4 mmol/L (ref 3.5–5.1)
Sodium: 141 mmol/L (ref 135–145)

## 2018-11-03 LAB — MAGNESIUM: Magnesium: 2.1 mg/dL (ref 1.7–2.4)

## 2018-11-03 NOTE — Addendum Note (Signed)
Addendum  created 11/03/18 1219 by Ollen Bowl, CRNA   Clinical Note Signed

## 2018-11-03 NOTE — Progress Notes (Signed)
PROGRESS NOTE  Keith Hale:[id number] DOB: Apr 28, 1980 DOA: 11/01/2018 PCP: Patient, No Pcp Per  Brief History:  38 y.o.malewithno known significant past medical history presented to ED for racing heart/palpitaton and exertional SOB.As per patient he was in his usual state of health 6 days prior to admission when he noticed dark-colored stools. For two days, the patient has been having palpitation with racing heart and exertional shortness of breath with dizziness. As the symptoms progressively got worse he decided to come to the ED for further evaluation.  Patient endorses to using ibuprofen 4 pills a day and aspirin 2 to 4 pills a day for the past 5 to 10 days for his lower back pain.No use of alcohol. No prior history of ulcers, heartburn. Endorses to remote history of appendectomy. No frank blood per rectum,No complaint of chest pain, headache, blurring of vision, abdominal pain dysuria and diarrhea. No complaint of nausea, anosmia, fever, chills. No sick contacts or travel history.   Pt presented with Hgb 6.5.  He was transfused 3 units PRBCs.  GI was consulted to assist with management.  Assessment/Plan: acute blood loss anemia/upper GI bleed  -secondary to NSAIDs -Duodenal ulcer appreciated on endoscopy with visible vessel -Hemostasis achieved with epinephrine injection and HemoSplit application -Patient has required the need of 3 units of PRBCs last 1 given today 11/02/2018 -Patient reports no further episodes of melanotic stools -tolerating regular diet -am CBC -Continue IV Protonix drip through 11/04/18 at 1400, then bid  tobacco abuse: -Cessation counseling provided -Patient looking to quit -Declined nicotine patch  B-12 deficiency -serum B12--174 -Started on B12 supplementation (anticipated schedule of repletion following 1000 mcg daily for 1 week, weekly for 1 month and then monthly after that) -Repeat B12 level in 3 months.  alcohol Use  -Socially -Patient reports no to be a problem and has not experienced withdrawal  hypokalemia -Repleted -Mag 2.1      Disposition Plan:   Home 7/16 if cleared by GI  Family Communication:   No Family at bedside  Consultants:  [facility]  Code Status:  FULL   DVT Prophylaxis:  SCDs   Procedures: As Listed in Progress Note Above  Antibiotics: None       Subjective: Had BM today without melena.  Patient denies fevers, chills, headache, chest pain, dyspnea, nausea, vomiting, diarrhea, abdominal pain, dysuria, hematuria, hematochezia, and melena.   Objective: Vitals:   11/02/18 1400 11/02/18 2205 11/03/18 0505 11/03/18 1450  BP: 109/62 115/69 96/65 109/62  Pulse: 78 71 67 78  Resp: 20 20 18 19   Temp: 98.8 F (37.1 C) 98.2 F (36.8 C) 98.9 F (37.2 C) 98.6 F (37 C)  TempSrc: Oral Oral Oral Oral  SpO2:  100% 100% 97%  Weight:      Height:        Intake/Output Summary (Last 24 hours) at 11/03/2018 1757 Last data filed at 11/03/2018 1300 Gross per 24 hour  Intake 1320 ml  Output -  Net 1320 ml   Weight change:  Exam:   General:  Pt is alert, follows commands appropriately, not in acute distress  HEENT: No icterus, No thrush, No neck mass, /AT  Cardiovascular: RRR, S1/S2, no rubs, no gallops  Respiratory: CTA bilaterally, no wheezing, no crackles, no rhonchi  Abdomen: Soft/+BS, non tender, non distended, no guarding  Extremities: No edema, No lymphangitis, No petechiae, No rashes, no synovitis   Data Reviewed: I have personally reviewed  following labs and imaging studies Basic Metabolic Panel: Recent Labs  Lab 11/01/18 0248 11/03/18 0522  NA 135 141  K 3.3* 4.0  CL 107 109  CO2 23 26  GLUCOSE 157* 97  BUN 41* 6  CREATININE 0.73 0.68  CALCIUM 7.3* 8.1*  MG  --  2.1   Liver Function Tests: Recent Labs  Lab 11/01/18 0248  AST 21  ALT 40  ALKPHOS 38  BILITOT 0.4  PROT 5.8*  ALBUMIN 3.1*   No results for input(s): LIPASE,  AMYLASE in the last 168 hours. No results for input(s): AMMONIA in the last 168 hours. Coagulation Profile: Recent Labs  Lab 11/01/18 0248  INR 1.2   CBC: Recent Labs  Lab 11/01/18 0248 11/01/18 0622 11/01/18 1443 11/01/18 1806 11/02/18 0439 11/02/18 1532 11/03/18 0522  WBC 12.4* 10.5  --  10.9* 10.2  --  6.9  NEUTROABS 7.7  --   --  7.2  --   --   --   HGB 5.9* 6.5* 7.3* 8.6* 6.8* 7.8* 8.2*  HCT 18.2* 20.2* 22.7* 27.2* 21.3* 23.8* 25.6*  MCV 85.8 87.1  --  90.4 90.3  --  90.5  PLT 292 235  --  247 225  --  251   Cardiac Enzymes: No results for input(s): CKTOTAL, CKMB, CKMBINDEX, TROPONINI in the last 168 hours. BNP: Invalid input(s): POCBNP CBG: No results for input(s): GLUCAP in the last 168 hours. HbA1C: No results for input(s): HGBA1C in the last 72 hours. Urine analysis:    Component Value Date/Time   COLORURINE YELLOW 11/01/2009 0630   APPEARANCEUR CLEAR 11/01/2009 0630   LABSPEC 1.020 11/01/2009 0630   PHURINE 7.5 11/01/2009 0630   GLUCOSEU NEGATIVE 11/01/2009 0630   HGBUR TRACE (A) 11/01/2009 0630   BILIRUBINUR NEGATIVE 11/01/2009 0630   KETONESUR 15 (A) 11/01/2009 0630   PROTEINUR NEGATIVE 11/01/2009 0630   UROBILINOGEN 0.2 11/01/2009 0630   NITRITE NEGATIVE 11/01/2009 0630   LEUKOCYTESUR NEGATIVE 11/01/2009 0630   Sepsis Labs: @LABRCNTIP (procalcitonin:4,lacticidven:4) ) Recent Results (from the past 240 hour(s))  SARS Coronavirus 2 (CEPHEID - Performed in [facility] hospital lab), Hosp Order     Status: None   Collection Time: 11/01/18  3:04 AM   Specimen: Nasopharyngeal Swab  Result Value Ref Range Status   SARS Coronavirus 2 NEGATIVE NEGATIVE Final    Comment: (NOTE) If result is NEGATIVE SARS-CoV-2 target nucleic acids are NOT DETECTED. The SARS-CoV-2 RNA is generally detectable in upper and lower  respiratory specimens during the acute phase of infection. The lowest  concentration of SARS-CoV-2 viral copies this assay can detect is 250   copies / mL. A negative result does not preclude SARS-CoV-2 infection  and should not be used as the sole basis for treatment or other  patient management decisions.  A negative result may occur with  improper specimen collection / handling, submission of specimen other  than nasopharyngeal swab, presence of viral mutation(s) within the  areas targeted by this assay, and inadequate number of viral copies  (<250 copies / mL). A negative result must be combined with clinical  observations, patient history, and epidemiological information. If result is POSITIVE SARS-CoV-2 target nucleic acids are DETECTED. The SARS-CoV-2 RNA is generally detectable in upper and lower  respiratory specimens dur ing the acute phase of infection.  Positive  results are indicative of active infection with SARS-CoV-2.  Clinical  correlation with patient history and other diagnostic information is  necessary to determine patient infection status.  Positive results do  not rule out bacterial infection or co-infection with other viruses. If result is PRESUMPTIVE POSTIVE SARS-CoV-2 nucleic acids MAY BE PRESENT.   A presumptive positive result was obtained on the submitted specimen  and confirmed on repeat testing.  While 2019 novel coronavirus  (SARS-CoV-2) nucleic acids may be present in the submitted sample  additional confirmatory testing may be necessary for epidemiological  and / or clinical management purposes  to differentiate between  SARS-CoV-2 and other Sarbecovirus currently known to infect humans.  If clinically indicated additional testing with an alternate test  methodology [phone] is advised. The SARS-CoV-2 RNA is generally  detectable in upper and lower respiratory sp ecimens during the acute  phase of infection. The expected result is Negative. Fact Sheet for Patients:  BoilerBrush.com.cyhttps://www.fda.gov/media/136312/download Fact Sheet for Healthcare Providers: https://pope.com/https://www.fda.gov/media/136313/download  This test is not yet approved or cleared by the [place] FDA and has been authorized for detection and/or diagnosis of SARS-CoV-2 by FDA under an Emergency Use Authorization (EUA).  This EUA will remain in effect (meaning this test can be used) for the duration of the COVID-19 declaration under Section 564(b)(1) of the Act, 21 U.S.C. section 360bbb-3(b)(1), unless the authorization is terminated or revoked sooner. Performed at [facility], [address] [place], [place] [address]      Scheduled Meds: . sodium chloride   Intravenous Once  . cyanocobalamin  1,000 mcg Intramuscular Daily  . lidocaine  10 mL Mouth/Throat Once   Continuous Infusions: . sodium chloride    . sodium chloride 75 mL/hr at 11/03/18 1642  . pantoprozole (PROTONIX) infusion 8 mg/hr (11/03/18 0952)  . sodium chloride      Procedures/Studies: Dg Chest Port 1 View  Result Date: 11/01/2018 CLINICAL DATA:  Tachycardia and shortness of breath EXAM: PORTABLE CHEST 1 VIEW COMPARISON:  12/10/2014 FINDINGS: The heart size and mediastinal contours are within normal limits. Both lungs are clear. The visualized skeletal structures are unremarkable. IMPRESSION: No active disease. Electronically Signed   By: Jasmine PangKim  Fujinaga M.D.   On: 11/01/2018 03:11    Catarina Hartshornavid Jamita Mckelvin, DO  [facility] Pager [phone]  If 7PM-7AM, please contact night-coverage [email or web address] Password [email or web address] 11/03/2018, 5:57 PM   LOS: 2 days

## 2018-11-03 NOTE — Progress Notes (Signed)
Subjective:  Feeling better. No BM since admission.   Objective: Vital signs in last 24 hours: Temp:  [98.2 F (36.8 C)-98.9 F (37.2 C)] 98.9 F (37.2 C) (07/15 0505) Pulse Rate:  [67-78] 67 (07/15 0505) Resp:  [18-20] 18 (07/15 0505) BP: (96-115)/(62-69) 96/65 (07/15 0505) SpO2:  [100 %] 100 % (07/15 0505) Last BM Date: 10/31/18 General:   Alert,  Well-developed, well-nourished, pleasant and cooperative in NAD Head:  Normocephalic and atraumatic. Eyes:  Sclera clear, no icterus.  Abdomen:  Soft, nontender and nondistended. Neurologic:  Alert and  oriented x4 Psych:  Alert and cooperative. Normal mood and affect.  Intake/Output from previous day: 07/14 0701 - 07/15 0700 In: 1275 [P.O.:960; Blood:315] Out: -  Intake/Output this shift: No intake/output data recorded.  Lab Results: CBC Recent Labs    11/01/18 1806 11/02/18 0439 11/02/18 1532 11/03/18 0522  WBC 10.9* 10.2  --  6.9  HGB 8.6* 6.8* 7.8* 8.2*  HCT 27.2* 21.3* 23.8* 25.6*  MCV 90.4 90.3  --  90.5  PLT 247 225  --  251   BMET Recent Labs    11/01/18 0248 11/03/18 0522  NA 135 141  K 3.3* 4.0  CL 107 109  CO2 23 26  GLUCOSE 157* 97  BUN 41* 6  CREATININE 0.73 0.68  CALCIUM 7.3* 8.1*   LFTs Recent Labs    11/01/18 0248  BILITOT 0.4  ALKPHOS 38  AST 21  ALT 40  PROT 5.8*  ALBUMIN 3.1*   No results for input(s): LIPASE in the last 72 hours. PT/INR Recent Labs    11/01/18 0248  LABPROT 15.1  INR 1.2      Imaging Studies: Dg Chest Port 1 View  Result Date: 11/01/2018 CLINICAL DATA:  Tachycardia and shortness of breath EXAM: PORTABLE CHEST 1 VIEW COMPARISON:  12/10/2014 FINDINGS: The heart size and mediastinal contours are within normal limits. Both lungs are clear. The visualized skeletal structures are unremarkable. IMPRESSION: No active disease. Electronically Signed   By: Donavan Foil M.D.   On: 11/01/2018 03:11  [2 weeks]   Assessment: Pleasant 38 year old Hispanic male who  presented to the ED on July 13 with palpitations, fatigue, dizziness and melena for 6 days in the setting of NSAID use.  No other upper or lower GI symptoms.  Hemoglobin 5.9 in the ED.  Iron, ferritin normal.  B12 slightly low, repletion has been started.  He received 2 units of packed red blood cells.  Hemoglobin up to 7.3 posttransfusion.  He required 3rd unit of blood yesterday and today his hemoglobin is on 8.2. VS stable. No evidence of overt GI bleeding.   July 13 EGD showed GI bleed due to duodenal ulcer with visible vessel.  Hemostasis achieved with hemo-spray application.  Mild gastritis due to NSAIDs.  Gastric biopsies pending.     Plan: 1. Complete 72 hours PPI infusion, until July 16 at 1400. Then PPI BID.  2. Full liquids.  3. F/u gastric biopsies.  4. OV in four months.   Laureen Ochs. Bernarda Caffey [facility] [phone] 7/15/20207:55 AM     LOS: 2 days

## 2018-11-03 NOTE — Addendum Note (Signed)
Addendum  created 11/03/18 0734 by Ollen Bowl, CRNA   Clinical Note Signed, SmartForm saved

## 2018-11-04 ENCOUNTER — Telehealth: Payer: Self-pay | Admitting: Gastroenterology

## 2018-11-04 ENCOUNTER — Encounter: Payer: Self-pay | Admitting: Gastroenterology

## 2018-11-04 DIAGNOSIS — B9681 Helicobacter pylori [H. pylori] as the cause of diseases classified elsewhere: Secondary | ICD-10-CM

## 2018-11-04 DIAGNOSIS — K2970 Gastritis, unspecified, without bleeding: Secondary | ICD-10-CM

## 2018-11-04 LAB — CBC
HCT: 25.5 % — ABNORMAL LOW (ref 39.0–52.0)
Hemoglobin: 8.1 g/dL — ABNORMAL LOW (ref 13.0–17.0)
MCH: 28.9 pg (ref 26.0–34.0)
MCHC: 31.8 g/dL (ref 30.0–36.0)
MCV: 91.1 fL (ref 80.0–100.0)
Platelets: 290 10*3/uL (ref 150–400)
RBC: 2.8 MIL/uL — ABNORMAL LOW (ref 4.22–5.81)
RDW: 15.9 % — ABNORMAL HIGH (ref 11.5–15.5)
WBC: 5.9 10*3/uL (ref 4.0–10.5)
nRBC: 0 % (ref 0.0–0.2)

## 2018-11-04 MED ORDER — PANTOPRAZOLE SODIUM 40 MG PO TBEC: 40.0000 mg | DELAYED_RELEASE_TABLET | Freq: Every day | ORAL | 2 refills | Status: DC

## 2018-11-04 MED ORDER — CLARITHROMYCIN 500 MG PO TABS: 500.0000 mg | ORAL_TABLET | Freq: Two times a day (BID) | ORAL | 0 refills | Status: DC

## 2018-11-04 MED ORDER — AMOXICILLIN 500 MG PO CAPS: 1000.0000 mg | ORAL_CAPSULE | Freq: Two times a day (BID) | ORAL | 0 refills | Status: DC

## 2018-11-04 MED ORDER — PANTOPRAZOLE SODIUM 40 MG IV SOLR
INTRAVENOUS | Status: AC
  Filled 2018-11-04: qty 80

## 2018-11-04 NOTE — Telephone Encounter (Signed)
Please make OV for hospital follow up with Keith Hale, [place], or myself in four months, Dx duodenal ulcer/h.pylori gastritis.

## 2018-11-04 NOTE — Clinical Social Work Note (Signed)
Interpreter line used for all communication with patient.  Patient advised that he is currently out of work due to [place] 19 and his wife is the only one working. He is concerned about his hospital bill. Advised that financial counselor contacts patient's who are uninsured to address these issues.   Patient needed [place] voucher and program was explained.   Patient was scheduled with [facility] for ongoing PCP involvement. Appointment is November 11, 2018 @3 :30. Patient provided [place] flyer with appointment information on it.     Kimra Kantor D.

## 2018-11-04 NOTE — Progress Notes (Signed)
Subjective:  Feels good. Last BM yesterday, solid black stool. Tolerating advanced diet.   Objective: Vital signs in last 24 hours: Temp:  [98.1 F (36.7 C)-98.6 F (37 C)] 98.1 F (36.7 C) (07/16 0523) Pulse Rate:  [67-78] 68 (07/16 0523) Resp:  [16-19] 16 (07/16 0523) BP: (99-109)/(56-65) 99/56 (07/16 0523) SpO2:  [96 %-97 %] 97 % (07/16 0523) Last BM Date: 11/03/18 General:   Alert,  Well-developed, well-nourished, pleasant and cooperative in NAD Head:  Normocephalic and atraumatic. Eyes:  Sclera clear, no icterus.  Abdomen:  Soft, nontender and nondistended.   Neurologic:  Alert and  oriented x4;  Psych:  Alert and cooperative. Normal mood and affect.  Intake/Output from previous day: 07/15 0701 - 07/16 0700 In: 7253 [P.O.:1320; I.V.:275] Out: -  Intake/Output this shift: Total I/O In: 240 [P.O.:240] Out: -   Lab Results: CBC Recent Labs    11/02/18 0439 11/02/18 1532 11/03/18 0522 11/04/18 0526  WBC 10.2  --  6.9 5.9  HGB 6.8* 7.8* 8.2* 8.1*  HCT 21.3* 23.8* 25.6* 25.5*  MCV 90.3  --  90.5 91.1  PLT 225  --  251 290   BMET Recent Labs    11/03/18 0522  NA 141  K 4.0  CL 109  CO2 26  GLUCOSE 97  BUN 6  CREATININE 0.68  CALCIUM 8.1*   LFTs No results for input(s): BILITOT, BILIDIR, IBILI, ALKPHOS, AST, ALT, PROT, ALBUMIN in the last 72 hours. No results for input(s): LIPASE in the last 72 hours. PT/INR No results for input(s): LABPROT, INR in the last 72 hours.    Imaging Studies: Dg Chest Port 1 View  Result Date: 11/01/2018 CLINICAL DATA:  Tachycardia and shortness of breath EXAM: PORTABLE CHEST 1 VIEW COMPARISON:  12/10/2014 FINDINGS: The heart size and mediastinal contours are within normal limits. Both lungs are clear. The visualized skeletal structures are unremarkable. IMPRESSION: No active disease. Electronically Signed   By: Donavan Foil M.D.   On: 11/01/2018 03:11  [2 weeks]   Assessment:  Pleasant 38 y/o Hispanic, English  speaking, male with profound anemia due to UGI bleeding in setting of ibuprofen and ASA powder use. He has received 3 units of prbcs this admission, Hgb stable in low 8 range for last 48 hours.   EGD this admission with duodenal ulcer with visible vessel s/p hemostatis with hemo-spray application. Mild gastritis noted. Gastric biopsies c/w H.pylori gastritis.    Plan: 1. Complete 72 hours PPI infusion today at 1400.  2. Go home on Pantoprazole 40mg  twice daily before a meal. 3. Start H.pylori regimen at time of discharge. Discussed importance of completing treatment with patient.  Amoxicillin 1 gram po BID for 14 days, Clarithromycin 500mg  po BID for 14 days, pantoprazole 40mg  BID for four months then reassess at office visit.  4. Return to the office in four months. 5. Will sign off. Thanks for consult.   Laureen Ochs. Bernarda Caffey [facility] [phone] 7/16/202010:10 AM     LOS: 3 days

## 2018-11-04 NOTE — Progress Notes (Signed)
Discharge instructions given with stated understanding.  Patient waiting for transportation home at this itme 

## 2018-11-04 NOTE — Plan of Care (Signed)
  Problem: Education: Goal: Knowledge of General Education information will improve Description: Including pain rating scale, medication(s)/side effects and non-pharmacologic comfort measures 11/04/2018 1253 by Rance Muir, RN Outcome: Adequate for Discharge 11/04/2018 0950 by Rance Muir, RN Outcome: Progressing   Problem: Health Behavior/Discharge Planning: Goal: Ability to manage health-related needs will improve 11/04/2018 1253 by Rance Muir, RN Outcome: Adequate for Discharge 11/04/2018 0950 by Rance Muir, RN Outcome: Progressing   Problem: Clinical Measurements: Goal: Ability to maintain clinical measurements within normal limits will improve 11/04/2018 1253 by Rance Muir, RN Outcome: Adequate for Discharge 11/04/2018 0950 by Rance Muir, RN Outcome: Progressing Goal: Will remain free from infection 11/04/2018 1253 by Rance Muir, RN Outcome: Adequate for Discharge 11/04/2018 0950 by Rance Muir, RN Outcome: Progressing Goal: Diagnostic test results will improve 11/04/2018 1253 by Rance Muir, RN Outcome: Adequate for Discharge 11/04/2018 0950 by Rance Muir, RN Outcome: Progressing Goal: Respiratory complications will improve 11/04/2018 1253 by Rance Muir, RN Outcome: Adequate for Discharge 11/04/2018 0950 by Rance Muir, RN Outcome: Progressing Goal: Cardiovascular complication will be avoided 11/04/2018 1253 by Rance Muir, RN Outcome: Adequate for Discharge 11/04/2018 0950 by Rance Muir, RN Outcome: Progressing   Problem: Activity: Goal: Risk for activity intolerance will decrease 11/04/2018 1253 by Rance Muir, RN Outcome: Adequate for Discharge 11/04/2018 0950 by Rance Muir, RN Outcome: Progressing   Problem: Nutrition: Goal: Adequate nutrition will be maintained 11/04/2018 1253 by Rance Muir, RN Outcome: Adequate for Discharge 11/04/2018 0950 by Rance Muir, RN Outcome: Progressing   Problem: Coping: Goal: Level of anxiety will decrease 11/04/2018 1253 by Rance Muir, RN Outcome: Adequate for  Discharge 11/04/2018 0950 by Rance Muir, RN Outcome: Progressing   Problem: Elimination: Goal: Will not experience complications related to bowel motility 11/04/2018 1253 by Rance Muir, RN Outcome: Adequate for Discharge 11/04/2018 0950 by Rance Muir, RN Outcome: Progressing Goal: Will not experience complications related to urinary retention 11/04/2018 1253 by Rance Muir, RN Outcome: Adequate for Discharge 11/04/2018 0950 by Rance Muir, RN Outcome: Progressing   Problem: Pain Managment: Goal: General experience of comfort will improve 11/04/2018 1253 by Rance Muir, RN Outcome: Adequate for Discharge 11/04/2018 0950 by Rance Muir, RN Outcome: Progressing   Problem: Safety: Goal: Ability to remain free from injury will improve 11/04/2018 1253 by Rance Muir, RN Outcome: Adequate for Discharge 11/04/2018 0950 by Rance Muir, RN Outcome: Progressing   Problem: Skin Integrity: Goal: Risk for impaired skin integrity will decrease 11/04/2018 1253 by Rance Muir, RN Outcome: Adequate for Discharge 11/04/2018 [place] by Rance Muir, RN Outcome: Progressing

## 2018-11-04 NOTE — Telephone Encounter (Signed)
OV made and letter mailed °

## 2018-11-04 NOTE — Plan of Care (Signed)

## 2018-11-04 NOTE — Discharge Summary (Signed)
Physician Discharge Summary  HERVE HAUG PXT:[id number] DOB: Jul 22, 1980 DOA: 11/01/2018  PCP: Patient, No Pcp Per  Admit date: 11/01/2018 Discharge date: 11/04/2018  Admitted From: Home Disposition:  Home   Recommendations for Outpatient Follow-up:  1. Follow up with PCP in 1-2 weeks 2. Please obtain BMP/CBC in one week     Discharge Condition: Stable CODE STATUS: FULL Diet recommendation: Heart Healthy  Brief/Interim Summary: 38 y.o.malewithno known significant past medical history presented to ED for racing heart/palpitaton and exertional SOB.As per patient he was in his usual state of health 6 days prior to admission when he noticed dark-colored stools. For two days, the patient has been having palpitation with racing heart and exertional shortness of breath with dizziness. As the symptoms progressively got worse he decided to come to the ED for further evaluation.  Patient endorses to using ibuprofen 4 pills a day and aspirin 2 to 4 pills a day for the past 5 to 10 days for his lower back pain.No use of alcohol. No prior history of ulcers, heartburn. Endorses to remote history of appendectomy. No frank blood per rectum,No complaint of chest pain, headache, blurring of vision, abdominal pain dysuria and diarrhea. No complaint of nausea, anosmia, fever, chills. No sick contacts or travel history.   Pt presented with Hgb 6.5.  He was transfused 3 units PRBCs.  GI was consulted to assist with management.  Discharge Diagnoses:   acute blood loss anemia/upper GI bleed  -secondary to NSAIDs -Duodenal ulcer appreciated on endoscopy with visible vessel -Hemostasis achieved with epinephrine injection and HemoSplit application -Patient has required the need of 3 units of PRBCs last 1 given today 11/02/2018 -Patient reports no further episodes of melanotic stools -tolerating regular diet -am CBC -Continue IV Protonix drip through 11/04/18 at 1400, then bid po  H Pylori  positive -Amoxicillin 1 gram po BID for 14 days, Clarithromycin 500mg  po BID for 14 days, pantoprazole 40mg  BID   tobacco abuse: -Cessation counseling provided -Patient looking to quit -Declined nicotine patch  B-12 deficiency -serum B12--174 -Started on B12 supplementation(anticipated schedule of repletion following 1000 mcg daily for 1 week, weekly for 1 month and then monthly after that) -Repeat B12 level in 3 months.  alcohol Use -Socially -Patient reports no to be a problem and has not experienced withdrawal  hypokalemia -Repleted -Mag 2.1  Discharge Instructions   Allergies as of 11/04/2018   No Known Allergies     Medication List    STOP taking these medications   ibuprofen 800 MG tablet Commonly known as: ADVIL     TAKE these medications   amoxicillin 500 MG capsule Commonly known as: AMOXIL Take 2 capsules (1,000 mg total) by mouth 2 (two) times daily.   clarithromycin 500 MG tablet Commonly known as: BIAXIN Take 1 tablet (500 mg total) by mouth 2 (two) times daily.   pantoprazole 40 MG tablet Commonly known as: Protonix Take 1 tablet (40 mg total) by mouth daily.       No Known Allergies  Consultations:  GI   Procedures/Studies: Dg Chest Port 1 View  Result Date: 11/01/2018 CLINICAL DATA:  Tachycardia and shortness of breath EXAM: PORTABLE CHEST 1 VIEW COMPARISON:  12/10/2014 FINDINGS: The heart size and mediastinal contours are within normal limits. Both lungs are clear. The visualized skeletal structures are unremarkable. IMPRESSION: No active disease. Electronically Signed   By: Donavan Foil M.D.   On: 11/01/2018 03:11        Discharge Exam: Vitals:  11/03/18 2107 11/04/18 0523  BP: 103/65 (!) 99/56  Pulse: 67 68  Resp: 18 16  Temp: 98.4 F (36.9 C) 98.1 F (36.7 C)  SpO2: 97% 97%   Vitals:   11/03/18 1450 11/03/18 1956 11/03/18 2107 11/04/18 0523  BP: 109/62  103/65 (!) 99/56  Pulse: 78  67 68  Resp: 19  18 16    Temp: 98.6 F (37 C)  98.4 F (36.9 C) 98.1 F (36.7 C)  TempSrc: Oral  Oral Oral  SpO2: 97% 96% 97% 97%  Weight:      Height:        General: Pt is alert, awake, not in acute distress Cardiovascular: RRR, S1/S2 +, no rubs, no gallops Respiratory: CTA bilaterally, no wheezing, no rhonchi Abdominal: Soft, NT, ND, bowel sounds + Extremities: no edema, no cyanosis   The results of significant diagnostics from this hospitalization (including imaging, microbiology, ancillary and laboratory) are listed below for reference.    Significant Diagnostic Studies: Dg Chest Port 1 View  Result Date: 11/01/2018 CLINICAL DATA:  Tachycardia and shortness of breath EXAM: PORTABLE CHEST 1 VIEW COMPARISON:  12/10/2014 FINDINGS: The heart size and mediastinal contours are within normal limits. Both lungs are clear. The visualized skeletal structures are unremarkable. IMPRESSION: No active disease. Electronically Signed   By: Jasmine PangKim  Fujinaga M.D.   On: 11/01/2018 03:11     Microbiology: Recent Results (from the past 240 hour(s))  SARS Coronavirus 2 (CEPHEID - Performed [facility]), Hosp Order     Status: None   Collection Time: 11/01/18  3:04 AM   Specimen: Nasopharyngeal Swab  Result Value Ref Range Status   SARS Coronavirus 2 NEGATIVE NEGATIVE Final    Comment: (NOTE) If result is NEGATIVE SARS-CoV-2 target nucleic acids are NOT DETECTED. The SARS-CoV-2 RNA is generally detectable in upper and lower  respiratory specimens during the acute phase of infection. The lowest  concentration of SARS-CoV-2 viral copies this assay can detect is 250  copies / mL. A negative result does not preclude SARS-CoV-2 infection  and should not be used as the sole basis for treatment or other  patient management decisions.  A negative result may occur with  improper specimen collection / handling, submission of specimen other  than nasopharyngeal swab, presence of viral mutation(s) within the   areas targeted by this assay, and inadequate number of viral copies  (<250 copies / mL). A negative result must be combined with clinical  observations, patient history, and epidemiological information. If result is POSITIVE SARS-CoV-2 target nucleic acids are DETECTED. The SARS-CoV-2 RNA is generally detectable in upper and lower  respiratory specimens dur ing the acute phase of infection.  Positive  results are indicative of active infection with SARS-CoV-2.  Clinical  correlation with patient history and other diagnostic information is  necessary to determine patient infection status.  Positive results do  not rule out bacterial infection or co-infection with other viruses. If result is PRESUMPTIVE POSTIVE SARS-CoV-2 nucleic acids MAY BE PRESENT.   A presumptive positive result was obtained on the submitted specimen  and confirmed on repeat testing.  While 2019 novel coronavirus  (SARS-CoV-2) nucleic acids may be present in the submitted sample  additional confirmatory testing may be necessary for epidemiological  and / or clinical management purposes  to differentiate between  SARS-CoV-2 and other Sarbecovirus currently known to infect humans.  If clinically indicated additional testing with an alternate test  methodology [phone]) is advised. The SARS-CoV-2 RNA is  generally  detectable in upper and lower respiratory sp ecimens during the acute  phase of infection. The expected result is Negative. Fact Sheet for Patients:  BoilerBrush.com.cyhttps://www.fda.gov/media/136312/download Fact Sheet for Healthcare Providers: https://pope.com/https://www.fda.gov/media/136313/download This test is not yet approved or cleared by the [place] FDA and has been authorized for detection and/or diagnosis of SARS-CoV-2 by FDA under an Emergency Use Authorization (EUA).  This EUA will remain in effect (meaning this test can be used) for the duration of the COVID-19 declaration under Section 564(b)(1) of the Act, 21 U.S.C.  section 360bbb-3(b)(1), unless the authorization is terminated or revoked sooner. Performed at [facility], [address] [place], [place] [address]      Labs: Basic Metabolic Panel: Recent Labs  Lab 11/01/18 0248 11/03/18 0522  NA 135 141  K 3.3* 4.0  CL 107 109  CO2 23 26  GLUCOSE 157* 97  BUN 41* 6  CREATININE 0.73 0.68  CALCIUM 7.3* 8.1*  MG  --  2.1   Liver Function Tests: Recent Labs  Lab 11/01/18 0248  AST 21  ALT 40  ALKPHOS 38  BILITOT 0.4  PROT 5.8*  ALBUMIN 3.1*   No results for input(s): LIPASE, AMYLASE in the last 168 hours. No results for input(s): AMMONIA in the last 168 hours. CBC: Recent Labs  Lab 11/01/18 0248 11/01/18 0622  11/01/18 1806 11/02/18 0439 11/02/18 1532 11/03/18 0522 11/04/18 0526  WBC 12.4* 10.5  --  10.9* 10.2  --  6.9 5.9  NEUTROABS 7.7  --   --  7.2  --   --   --   --   HGB 5.9* 6.5*   < > 8.6* 6.8* 7.8* 8.2* 8.1*  HCT 18.2* 20.2*   < > 27.2* 21.3* 23.8* 25.6* 25.5*  MCV 85.8 87.1  --  90.4 90.3  --  90.5 91.1  PLT 292 235  --  247 225  --  251 290   < > = values in this interval not displayed.   Cardiac Enzymes: No results for input(s): CKTOTAL, CKMB, CKMBINDEX, TROPONINI in the last 168 hours. BNP: Invalid input(s): POCBNP CBG: No results for input(s): GLUCAP in the last 168 hours.  Time coordinating discharge:  36 minutes  Signed:  Catarina Hartshornavid Dorine Duffey, DO [facility] Pager: [phone] 11/04/2018, 12:45 PM

## 2018-11-04 NOTE — Discharge Instructions (Signed)
Hemorrogia Gastrointestinal  Gastrointestinal Bleeding  Una hemorragia gastrointestinal (GI) es el sangrado en alguna zona a lo largo del trayecto que sigue la comida a través del cuerpo (tubo digestivo). Puede haber sangrado en cualquier lugar de este trayecto entre la boca y el orificio anal (ano). Tal vez haya sangre en la materia fecal (heces) o la materia fecal puede ser de color negro. Si tiene vómitos, pueden ser con sangre.  Este trastorno puede ser leve, grave o, incluso, potencialmente mortal. Si la hemorragia es muy abundante, es posible que deba permanecer en el hospital.  ¿Cuáles son las causas?  Esta afección puede ser causada por lo siguiente:  · Irritación e hinchazón del esófago (esofagitis). El esófago es la parte del cuerpo que transporta los alimentos desde la boca hasta el estómago.  · Venas hinchadas en el ano (hemorroides).  · Zonas de desgarro doloroso en el orificio anal (fisuras anales). Generalmente se producen cuando se evacua materia fecal dura.  · Bolsitas que se forman en el colon con el tiempo (diverticulosis).  · Irritación e hinchazón (diverticulitis) en las zonas donde se han formado bolsitas en el colon.  · Crecimientos (pólipos) o cáncer. El cáncer de colon a menudo comienza como crecimientos que no son cancerosos.  · Irritación de la membrana que cubre el estómago (gastritis).  · Llagas (úlceras) en el estómago.  ¿Qué incrementa el riesgo?  Es más probable que tenga esta afección si:  · Tiene cierto tipo de infección en el estómago (infección por Helicobacter pylori).  · Toma ciertos medicamentos.  · Fuma.  · Bebe alcohol.  ¿Cuáles son los signos o los síntomas?  Los síntomas frecuentes de esta afección incluyen los siguientes:  · Vómitos con sangre de color rojo brillante. Esto tiene un aspecto similar al poso del café.  · Cambios en la materia fecal. La materia fecal puede:  ? Tener sangre roja.  ? Ser negra, similar al alquitrán y tener un olor más fuerte que lo  normal.  ? Ser roja.  · Dolor o cólicos en el vientre (abdomen).  ¿Cómo se trata?  El tratamiento de esta afección depende de la causa de la hemorragia. Por ejemplo:  · A veces, la hemorragia puede detenerse durante un procedimiento que se realiza para encontrar el problema (endoscopía o colonoscopía).  · Los medicamentos pueden usarse para lo siguiente:  ? Ayudar a controlar la irritación, la hinchazón o la infección.  ? Reducir el ácido en el estómago.  · Ciertos problemas pueden tratarse con lo siguiente:  ? Cremas.  ? Medicamentos que se colocan en el ano (supositorios).  ? Baños calientes.  · A veces es necesario realizar una cirugía.  · Si perdió mucha sangre, es posible que necesite una transfusión.  Si el sangrado es leve, posiblemente se le permita ir a su casa. Si el sangrado es muy abundante, deberá permanecer en el hospital.  Siga estas indicaciones en su casa:    · Tome los medicamentos de venta libre y los recetados solamente como se lo haya indicado el médico.  · Consuma alimentos con alto contenido de fibra. Estos alimentos incluyen los frijoles, cereales integrales y frutas y verduras frescas. También intente comer 1 a 3 ciruelas por día.  · Beba suficiente líquido para mantener el pis (la orina) de color amarillo pálido.  · Concurra a todas las visitas de control como se lo haya indicado el médico. Esto es importante.  Comuníquese con un médico si:  · Los síntomas no mejoran.    Solicite ayuda inmediatamente si:  · La hemorragia no se detiene.  · Se siente mareado o se desvanece (se desmaya).  · Se siente débil.  · Tiene espasmos muy intensos en el vientre o en la espalda.  · Elimina grandes grumos de sangre (coágulos) en la materia fecal.  · Los síntomas empeoran.  · Siente dolor en el pecho o latidos cardíacos acelerados.  Resumen  · Una hemorragia gastrointestinal es el sangrado en alguna zona a lo largo del trayecto que sigue la comida a través del cuerpo (tubo digestivo).  · La hemorragia puede  tener diferentes causas. El tratamiento depende de la causa del sangrado.  · Tome los medicamentos solamente como se lo haya indicado el médico.  · Concurra a todas las visitas de control como se lo haya indicado el médico. Esto es importante.  Esta información no tiene como fin reemplazar el consejo del médico. Asegúrese de hacerle al médico cualquier pregunta que tenga.  Document Released: 05/10/2010 Document Revised: 01/05/2018 Document Reviewed: 01/05/2018  Elsevier Patient Education © 2020 Elsevier Inc.

## 2018-11-09 ENCOUNTER — Telehealth (HOSPITAL_COMMUNITY): Payer: Self-pay

## 2018-11-09 NOTE — Telephone Encounter (Signed)
Pt called could not speak english very well, did not know whom he wanted to talk to from him hospital room. I look and we did not have a referral for him-I was unable to help the patient.

## 2018-11-12 ENCOUNTER — Telehealth: Payer: Self-pay

## 2018-11-12 NOTE — Telephone Encounter (Signed)
Pt[facility] will be going to the free clinic. Faxed over [employer] application to [employer] and all paperwork is on file.  Drema Halon

## 2018-11-15 ENCOUNTER — Ambulatory Visit: Payer: Self-pay | Admitting: Physician Assistant

## 2018-11-16 ENCOUNTER — Other Ambulatory Visit: Payer: Self-pay

## 2018-11-16 ENCOUNTER — Encounter: Payer: Self-pay | Admitting: Physician Assistant

## 2018-11-16 ENCOUNTER — Ambulatory Visit: Payer: Self-pay | Admitting: Physician Assistant

## 2018-11-16 VITALS — BP 116/60 | HR 95 | Temp 97.7°F | Wt 187.2 lb

## 2018-11-16 DIAGNOSIS — Z13220 Encounter for screening for lipoid disorders: Secondary | ICD-10-CM

## 2018-11-16 DIAGNOSIS — Z131 Encounter for screening for diabetes mellitus: Secondary | ICD-10-CM

## 2018-11-16 DIAGNOSIS — K922 Gastrointestinal hemorrhage, unspecified: Secondary | ICD-10-CM

## 2018-11-16 DIAGNOSIS — Z7689 Persons encountering health services in other specified circumstances: Secondary | ICD-10-CM

## 2018-11-16 DIAGNOSIS — Z789 Other specified health status: Secondary | ICD-10-CM

## 2018-11-16 DIAGNOSIS — T39395A Adverse effect of other nonsteroidal anti-inflammatory drugs [NSAID], initial encounter: Secondary | ICD-10-CM

## 2018-11-16 DIAGNOSIS — D649 Anemia, unspecified: Secondary | ICD-10-CM

## 2018-11-16 NOTE — Progress Notes (Signed)
BP 116/60   Pulse 95   Temp 97.7 F (36.5 C)   Wt 187 lb 3.2 oz (84.9 kg)   SpO2 98%   BMI 27.64 kg/m    Subjective:    Patient ID: Keith Hale, male    DOB: [birth year], 38 y.o.   MRN: [id number]  HPI: Keith Hale is a 38 y.o. male presenting on 11/16/2018 for New Patient (Initial Visit) (pt has not had PCP. pt is here to establish care. pt was admitted at [facility] - ER for GI bleed.for 4 days. pt states he was told his blood level was low at 8.2 when he was discharged and was told it should be at 12. pt wants to recheck.)   HPI   Pt had a negative covid 19 screening questionnaire    Pt was taking lots of IBU for hip pain.  It hurt it about 15 years ago in [facility].  He does not work currently.    Pt says he quit smoking last year.   Pt says he is feeling good and has no complaints today.     Relevant past medical, surgical, family and social history reviewed and updated as indicated. Interim medical history since our last visit reviewed. Allergies and medications reviewed and updated.   Current Outpatient Medications:  .  amoxicillin (AMOXIL) 500 MG capsule, Take 2 capsules (1,000 mg total) by mouth 2 (two) times daily., Disp: 56 capsule, Rfl: 0 .  clarithromycin (BIAXIN) 500 MG tablet, Take 1 tablet (500 mg total) by mouth 2 (two) times daily., Disp: 28 tablet, Rfl: 0 .  pantoprazole (PROTONIX) 40 MG tablet, Take 1 tablet (40 mg total) by mouth daily., Disp: 60 tablet, Rfl: 2    Review of Systems  Per HPI unless specifically indicated above     Objective:    BP 116/60   Pulse 95   Temp 97.7 F (36.5 C)   Wt 187 lb 3.2 oz (84.9 kg)   SpO2 98%   BMI 27.64 kg/m   Wt Readings from Last 3 Encounters:  11/16/18 187 lb 3.2 oz (84.9 kg)  11/01/18 194 lb 10.7 oz (88.3 kg)  12/10/14 170 lb (77.1 kg)    Physical Exam Vitals signs reviewed.  Constitutional:      General: He is not in acute distress.    Appearance: Normal appearance. He is well-developed. He is not  ill-appearing.  HENT:     Head: Normocephalic and atraumatic.  Eyes:     Conjunctiva/sclera: Conjunctivae normal.     Pupils: Pupils are equal, round, and reactive to light.  Neck:     Musculoskeletal: Neck supple.     Thyroid: No thyromegaly.  Cardiovascular:     Rate and Rhythm: Normal rate and regular rhythm.  Pulmonary:     Effort: Pulmonary effort is normal.     Breath sounds: Normal breath sounds. No wheezing or rales.  Abdominal:     General: Bowel sounds are normal.     Palpations: Abdomen is soft. There is no mass.     Tenderness: There is no abdominal tenderness.  Lymphadenopathy:     Cervical: No cervical adenopathy.  Skin:    General: Skin is warm and dry.     Findings: No rash.  Neurological:     Mental Status: He is alert and oriented to person, place, and time.  Psychiatric:        Attention and Perception: Attention normal.        Speech:  Speech normal.        Behavior: Behavior is cooperative.         Assessment & Plan:    Encounter Diagnoses  Name Primary?  . Encounter to establish care Yes  . Anemia, unspecified type   . Screening cholesterol level   . Screening for diabetes mellitus   . GI bleed due to NSAIDs   . Not proficient in [place] language       -will recheck h/h.   Screen for dyslipidemia and diabetes -encouraged regular Exercise -recomnended daily otc iron until anemia resolves -Counseled to avoid nsaids.  Gave sample APAP to use if analgesic needed.  Counseled to avoid taking more than directed -pt to follow up 2 months with  EVISIT to recheck h/h.  Pt to contact office sooner prn

## 2018-11-19 ENCOUNTER — Other Ambulatory Visit (HOSPITAL_COMMUNITY)
Admission: RE | Admit: 2018-11-19 | Discharge: 2018-11-19 | Disposition: A | Discharge status: Home / Self Care | Payer: Self-pay | Source: Ambulatory Visit | Attending: Physician Assistant | Admitting: Physician Assistant

## 2018-11-19 ENCOUNTER — Other Ambulatory Visit: Payer: Self-pay

## 2018-11-19 DIAGNOSIS — Z13220 Encounter for screening for lipoid disorders: Secondary | ICD-10-CM | POA: Insufficient documentation

## 2018-11-19 DIAGNOSIS — Z131 Encounter for screening for diabetes mellitus: Secondary | ICD-10-CM | POA: Insufficient documentation

## 2018-11-19 DIAGNOSIS — D649 Anemia, unspecified: Secondary | ICD-10-CM | POA: Insufficient documentation

## 2018-11-19 LAB — HEMOGLOBIN AND HEMATOCRIT, BLOOD
HCT: 32.1 % — ABNORMAL LOW (ref 39.0–52.0)
Hemoglobin: 9.7 g/dL — ABNORMAL LOW (ref 13.0–17.0)

## 2018-11-19 LAB — HEMOGLOBIN A1C
Hgb A1c MFr Bld: 4.8 % (ref 4.8–5.6)
Mean Plasma Glucose: 91.06 mg/dL

## 2018-11-19 LAB — LIPID PANEL
Cholesterol: 188 mg/dL (ref 0–200)
HDL: 39 mg/dL — ABNORMAL LOW (ref >40)
LDL Cholesterol: 139 mg/dL — ABNORMAL HIGH (ref 0–99)
Total CHOL/HDL Ratio: 4.8 RATIO
Triglycerides: 51 mg/dL (ref <150)
VLDL: 10 mg/dL (ref 0–40)

## 2018-11-22 ENCOUNTER — Other Ambulatory Visit: Payer: Self-pay | Admitting: Physician Assistant

## 2018-11-22 DIAGNOSIS — D649 Anemia, unspecified: Secondary | ICD-10-CM

## 2018-12-01 ENCOUNTER — Encounter: Payer: Self-pay | Admitting: Physician Assistant

## 2018-12-01 ENCOUNTER — Encounter: Payer: Self-pay | Admitting: Student

## 2018-12-01 NOTE — Patient Instructions (Signed)
Prevencin del colesterol alto Preventing High Cholesterol El colesterol es una sustancia cerosa parecida a la grasa que el organismo necesita en pequeas cantidades. El hgado fabrica todo el colesterol que el cuerpo necesita. Tener el colesterol alto (hipercolesterolemia) aumenta el riesgo de sufrir enfermedades cardacas y accidentes cerebrovasculares. El colesterol extra (exceso de colesterol) proviene de los alimentos que come, por ejemplo grasas de origen animal (grasas saturadas) de la carne y de algunos productos lcteos. El colesterol alto con frecuencia puede prevenirse con cambios en la dieta y en el estilo de vida. Si ya tiene Orthoptist, puede controlarlo haciendo cambios en la dieta y en el estilo de vida, adems de con medicamentos. Qu cambios en la alimentacin se pueden hacer?  Coma menos grasas saturadas. Los alimentos que contienen grasas saturadas incluyen las carnes rojas y algunos productos lcteos.  Evite las carnes procesadas, como el tocino, los fiambres y embutidos.  Evite las grasas trans, que se encuentran en la margarina y en algunos productos horneados.  Evite alimentos y bebidas que tengan azcares agregados.  Consuma ms frutas, verduras y cereales integrales.  Elija fuentes saludables de protenas, como el pescado, la carne de ave y los frutos secos.  Elija fuentes saludables de grasas, por ejemplo: ? Frutos secos. ? Aceites vegetales, en particular el aceite de oliva. ? Pescados que contengan grasas saludables (cidos grasos omega-3), como la caballa o el salmn. Qu cambios en el estilo de vida se pueden realizar?   Baje de peso si es necesario. Bajar entre 5 y 10lb (2,3 a 4,5kg) puede ayudar a prevenir o Academic librarian alto y a Psychiatrist de padecer diabetes y presin arterial alta (hipertensin). Pdale al mdico que le recomiende una dieta y un plan de ejercicios para bajar de peso de forma segura.  Ejerctese lo suficiente.  Debe realizar al menos 147minutos de ejercicios de intensidad moderada todas las semanas. ? Patent examiner en sesiones cortas de ejercicios, varias veces al da, o puede realizar sesiones ms largas, pero menos veces por semana. Por ejemplo, puede realizar una caminata enrgica o andar en bicicleta durante 80minutos, 3veces al da, durante 5das a la semana.  No fume. Si necesita ayuda para dejar de fumar, consulte al mdico.  Limite el consumo de bebidas alcohlicas. Si bebe alcohol, limite el consumo a no ms de 12medida por da si es mujer y no est [place], y 73medidas por da si es hombre. Una medida equivale a 12onzas de cerveza, 5onzas de vino o 1onzas de bebidas alcohlicas de alta graduacin. Por qu son importantes estos cambios?  Si tiene Orthoptist, se pueden acumular depsitos de esta sustancia (placa) en las paredes de los vasos sanguneos. La placa hace que las arterias se vuelvan ms estrechas y rgidas, lo que puede limitar u obstruir la circulacin sangunea y Actor la formacin de cogulos de [place]. Esto aumenta en gran medida el riesgo de infarto de miocardio y de accidente cerebrovascular. Hacer cambios en la dieta y en el estilo de vida puede ayudar a reducir el riesgo de sufrir estas afecciones potencialmente mortales. Qu puedo hacer para reducir mis riesgos?  Controle los factores de riesgo del colesterol alto. Hable con el mdico acerca de todos los factores de riesgo y cmo reducir Catering manager.  Controle otras afecciones que pueda tener, por ejemplo diabetes o presin arterial alta (hipertensin).  Contrlese el colesterol a intervalos regulares.  Concurra a todas las visitas de control como se lo haya indicado el mdico.  Esto es importante. Cmo se trata? Adems de los cambios en la dieta y en el estilo de vida, el mdico puede recomendarle que tome ciertos medicamentos para reducir el colesterol, por ejemplo, medicamentos que reducen la  cantidad de colesterol producida por el hgado. Es posible que necesite tomar medicamentos si:  No logra reducir lo suficiente el colesterol con cambios en la dieta y en el estilo de vida.  Tiene colesterol alto y presenta otros factores de riesgo de sufrir enfermedades cardacas o accidentes cerebrovasculares. Tome los medicamentos de venta libre y los recetados solamente como se lo haya indicado el mdico. Dnde encontrar ms informacin  Asociacin Estadounidense de Catering manager (Musician, Medical illustrator): [email or web address]  [place], Education officer, museum y Music therapist (Water engineer, Lung, and [place]): [email or web address] Resumen  El colesterol alto aumenta el riesgo de sufrir enfermedades cardacas y accidentes cerebrovasculares. Si mantiene el colesterol bajo, puede reducir el riesgo de tener estas afecciones.  Los [employer] dieta y en el estilo de vida son los pasos ms importantes para prevenir Advertising account planner.  Consulte al mdico para controlar los factores de riesgo y hgase anlisis de sangre con regularidad. Esta informacin no tiene Marine scientist el consejo del mdico. Asegrese de hacerle al mdico cualquier pregunta que tenga. Document Released: 04/22/2015 Document Revised: 07/16/2016 Document Reviewed: 04/22/2015 Elsevier Patient Education  2020 [employer].

## 2018-12-01 NOTE — Progress Notes (Signed)
Pt is requesting low fat diet reading material to be mailed to him. LPN is placing material for outgoing mail on 12-01-18

## 2019-01-06 ENCOUNTER — Other Ambulatory Visit (HOSPITAL_COMMUNITY)
Admission: RE | Admit: 2019-01-06 | Discharge: 2019-01-06 | Disposition: A | Discharge status: Home / Self Care | Payer: Self-pay | Source: Ambulatory Visit | Attending: Physician Assistant | Admitting: Physician Assistant

## 2019-01-06 DIAGNOSIS — D649 Anemia, unspecified: Secondary | ICD-10-CM | POA: Insufficient documentation

## 2019-01-06 LAB — HEMOGLOBIN AND HEMATOCRIT, BLOOD
HCT: 44.1 % (ref 39.0–52.0)
Hemoglobin: 13.1 g/dL (ref 13.0–17.0)

## 2019-01-17 ENCOUNTER — Ambulatory Visit: Payer: Self-pay | Admitting: Physician Assistant

## 2019-01-26 ENCOUNTER — Ambulatory Visit: Payer: Self-pay | Admitting: Physician Assistant

## 2019-02-01 ENCOUNTER — Ambulatory Visit: Payer: Self-pay | Admitting: Physician Assistant

## 2019-02-01 ENCOUNTER — Encounter: Payer: Self-pay | Admitting: Physician Assistant

## 2019-02-01 DIAGNOSIS — Z789 Other specified health status: Secondary | ICD-10-CM

## 2019-02-01 DIAGNOSIS — E785 Hyperlipidemia, unspecified: Secondary | ICD-10-CM

## 2019-02-01 NOTE — Progress Notes (Signed)
   There were no vitals taken for this visit.   Subjective:    Patient ID: Keith Hale, male    DOB: 06/13/1980, 38 y.o.   MRN: [id number]  HPI: Keith Hale is a 38 y.o. male presenting on 02/01/2019 for No chief complaint on file.   HPI  This is a telemedicine appointemnt through [employer] due to coronavirus pandemic  I connected with  Keith Hale on 02/01/19 by a video enabled telemedicine application and verified that I am speaking with the correct person using two identifiers.   I discussed the limitations of evaluation and management by telemedicine. The patient expressed understanding and agreed to proceed.  Pt is at home.   Provider is at office.    [name].     Relevant past medical, surgical, family and social history reviewed and updated as indicated. Interim medical history since our last visit reviewed. Allergies and medications reviewed and updated.  No current outpatient medications on file.    Review of Systems  Per HPI unless specifically indicated above     Objective:    There were no vitals taken for this visit.  Wt Readings from Last 3 Encounters:  11/16/18 187 lb 3.2 oz (84.9 kg)  11/01/18 194 lb 10.7 oz (88.3 kg)  12/10/14 170 lb (77.1 kg)    Physical Exam Constitutional:      General: He is not in acute distress.    Appearance: Normal appearance. He is not ill-appearing.  HENT:     Head: Normocephalic and atraumatic.  Pulmonary:     Effort: Pulmonary effort is normal. No respiratory distress.  Neurological:     Mental Status: He is alert and oriented to person, place, and time.  Psychiatric:        Attention and Perception: Attention normal.        Speech: Speech normal.        Behavior: Behavior is cooperative.     Results for orders placed or performed during the hospital encounter of 01/06/19  Hemoglobin and hematocrit, blood  Result Value Ref Range   Hemoglobin 13.1 13.0 - 17.0 g/dL   HCT 44.1 39.0 - 52.0 %      Assessment & Plan:   Encounter Diagnoses  Name Primary?  . Hyperlipidemia, unspecified hyperlipidemia type Yes  . Not proficient in [place] language       -Reviewed labs with pt -cousneled avoid etoh -Follow up with GI as scheduled -pt got influenza vaccination yesterday -pt to follow up here 6 months with  recheck lipids.  He is to contact office sooner   .

## 2019-03-08 ENCOUNTER — Ambulatory Visit (INDEPENDENT_AMBULATORY_CARE_PROVIDER_SITE_OTHER): Payer: Self-pay | Admitting: Gastroenterology

## 2019-03-08 ENCOUNTER — Other Ambulatory Visit: Payer: Self-pay

## 2019-03-08 ENCOUNTER — Encounter: Payer: Self-pay | Admitting: Gastroenterology

## 2019-03-08 VITALS — BP 126/79 | HR 65 | Temp 96.8°F | Wt 197.6 lb

## 2019-03-08 DIAGNOSIS — B9681 Helicobacter pylori [H. pylori] as the cause of diseases classified elsewhere: Secondary | ICD-10-CM

## 2019-03-08 DIAGNOSIS — K264 Chronic or unspecified duodenal ulcer with hemorrhage: Secondary | ICD-10-CM | POA: Insufficient documentation

## 2019-03-08 DIAGNOSIS — E538 Deficiency of other specified B group vitamins: Secondary | ICD-10-CM

## 2019-03-08 DIAGNOSIS — K2970 Gastritis, unspecified, without bleeding: Secondary | ICD-10-CM

## 2019-03-08 NOTE — Patient Instructions (Signed)
1. Please go to lab at [facility] for stool and labs.  We will contact you with results as available.

## 2019-03-08 NOTE — Assessment & Plan Note (Signed)
Hospitalization in July with profound anemia in the setting melena.  He has been on aspirin powder and ibuprofen for back pain.  Hemoglobin in the 5 range when he presented.  EGD showed duodenal ulcer with visible vessel status post hemostasis with hemospray application.  He was also found to have H. pylori gastritis.  He states he completed treatment.  His anemia has resolved.  Clinically he feels well.  We will check for H. pylori eradication with stool antigen.  We will also check up on B12 deficiency.

## 2019-03-08 NOTE — Progress Notes (Signed)
      Primary Care Physician: Soyla Dryer, PA-C  Primary Gastroenterologist:  Barney Drain, MD  Chief Complaint  Patient presents with  . H. Pylori    doing ok    HPI: Keith Hale is a 38 y.o. male here for hospital follow up of duodenal ulcer/H.pylori gastritis. He presented with profound anemia due to UGI bleeding in setting of ibuprofen and ASA powder use. Received 3 units of prbncs. EGD showed duodenal ulcer with visible vessel s/p hemostasis with hemospray application. Mild gastritis. Bx c/w H.pylori gastritis.  He was treated with amoxicillin, clarithromycin, pantoprazole.  He states he completed treatment.  He denies further aspirin and NSAID use.  Clinically he is feeling well.  No further black stools or bloody stools.  Bowel movements are regular.  No abdominal pain.  No heartburn, vomiting, dysphagia.  Appetite is good.  He remains on vitamin B12 oral supplements.  Recent H&H normal. No current outpatient medications on file.   No current facility-administered medications for this visit.     Allergies as of 03/08/2019  . (No Known Allergies)    ROS:  General: Negative for anorexia, weight loss, fever, chills, fatigue, weakness. ENT: Negative for hoarseness, difficulty swallowing , nasal congestion. CV: Negative for chest pain, angina, palpitations, dyspnea on exertion, peripheral edema.  Respiratory: Negative for dyspnea at rest, dyspnea on exertion, cough, sputum, wheezing.  GI: See history of present illness. GU:  Negative for dysuria, hematuria, urinary incontinence, urinary frequency, nocturnal urination.  Endo: Negative for unusual weight change.    Physical Examination:   BP 126/79   Pulse 65   Temp (!) 96.8 F (36 C) (Temporal)   Wt 197 lb 9.6 oz (89.6 kg)   BMI 29.18 kg/m   General: Well-nourished, well-developed in no acute distress.  Eyes: No icterus. Mouth: Oropharyngeal mucosa moist and pink , no lesions erythema or exudate. Lungs:  Clear to auscultation bilaterally.  Heart: Regular rate and rhythm, no murmurs rubs or gallops.  Abdomen: Bowel sounds are normal, nontender, nondistended, no hepatosplenomegaly or masses, no abdominal bruits or hernia , no rebound or guarding.   Extremities: No lower extremity edema. No clubbing or deformities. Neuro: Alert and oriented x 4   Skin: Warm and dry, no jaundice.   Psych: Alert and cooperative, normal mood and affect.  Labs:  Lab Results  Component Value Date   WBC 5.9 11/04/2018   HGB 13.1 01/06/2019   HCT 44.1 01/06/2019   MCV 91.1 11/04/2018   PLT 290 11/04/2018   Lab Results  Component Value Date   CREATININE 0.68 11/03/2018   BUN 6 11/03/2018   NA 141 11/03/2018   K 4.0 11/03/2018   CL 109 11/03/2018   CO2 26 11/03/2018   Lab Results  Component Value Date   ALT 40 11/01/2018   AST 21 11/01/2018   ALKPHOS 38 11/01/2018   BILITOT 0.4 11/01/2018    Imaging Studies: No results found.

## 2019-03-09 LAB — VITAMIN B12: Vitamin B-12: 849 pg/mL (ref 232–1245)

## 2019-03-17 LAB — H. PYLORI ANTIGEN, STOOL: H pylori Ag, Stl: NEGATIVE

## 2019-08-01 ENCOUNTER — Ambulatory Visit: Payer: Self-pay | Admitting: Physician Assistant

## 2020-10-07 ENCOUNTER — Encounter (HOSPITAL_COMMUNITY): Payer: Self-pay

## 2020-10-07 ENCOUNTER — Emergency Department (HOSPITAL_COMMUNITY)
Admission: EM | Admit: 2020-10-07 | Discharge: 2020-10-07 | Disposition: A | Discharge status: Home / Self Care | Payer: Self-pay | Attending: Emergency Medicine | Admitting: Emergency Medicine

## 2020-10-07 ENCOUNTER — Emergency Department (HOSPITAL_COMMUNITY): Payer: Self-pay

## 2020-10-07 ENCOUNTER — Other Ambulatory Visit: Payer: Self-pay

## 2020-10-07 DIAGNOSIS — R110 Nausea: Secondary | ICD-10-CM | POA: Insufficient documentation

## 2020-10-07 DIAGNOSIS — Z9049 Acquired absence of other specified parts of digestive tract: Secondary | ICD-10-CM | POA: Insufficient documentation

## 2020-10-07 DIAGNOSIS — Z87891 Personal history of nicotine dependence: Secondary | ICD-10-CM | POA: Insufficient documentation

## 2020-10-07 DIAGNOSIS — R1013 Epigastric pain: Secondary | ICD-10-CM | POA: Insufficient documentation

## 2020-10-07 LAB — CBC WITH DIFFERENTIAL/PLATELET
Abs Immature Granulocytes: 0.03 10*3/uL (ref 0.00–0.07)
Basophils Absolute: 0.1 10*3/uL (ref 0.0–0.1)
Basophils Relative: 1 %
Eosinophils Absolute: 0.3 10*3/uL (ref 0.0–0.5)
Eosinophils Relative: 4 %
HCT: 44.1 % (ref 39.0–52.0)
Hemoglobin: 14.3 g/dL (ref 13.0–17.0)
Immature Granulocytes: 0 %
Lymphocytes Relative: 40 %
Lymphs Abs: 3 10*3/uL (ref 0.7–4.0)
MCH: 27.3 pg (ref 26.0–34.0)
MCHC: 32.4 g/dL (ref 30.0–36.0)
MCV: 84.2 fL (ref 80.0–100.0)
Monocytes Absolute: 0.7 10*3/uL (ref 0.1–1.0)
Monocytes Relative: 9 %
Neutro Abs: 3.3 10*3/uL (ref 1.7–7.7)
Neutrophils Relative %: 46 %
Platelets: 321 10*3/uL (ref 150–400)
RBC: 5.24 MIL/uL (ref 4.22–5.81)
RDW: 13.2 % (ref 11.5–15.5)
WBC: 7.4 10*3/uL (ref 4.0–10.5)
nRBC: 0 % (ref 0.0–0.2)

## 2020-10-07 LAB — COMPREHENSIVE METABOLIC PANEL
ALT: 37 U/L (ref 0–44)
AST: 28 U/L (ref 15–41)
Albumin: 4.2 g/dL (ref 3.5–5.0)
Alkaline Phosphatase: 54 U/L (ref 38–126)
Anion gap: 7 (ref 5–15)
BUN: 17 mg/dL (ref 6–20)
CO2: 25 mmol/L (ref 22–32)
Calcium: 9.1 mg/dL (ref 8.9–10.3)
Chloride: 103 mmol/L (ref 98–111)
Creatinine, Ser: 0.75 mg/dL (ref 0.61–1.24)
GFR, Estimated: >60 mL/min (ref >60)
Glucose, Bld: 104 mg/dL — ABNORMAL HIGH (ref 70–99)
Potassium: 3.8 mmol/L (ref 3.5–5.1)
Sodium: 135 mmol/L (ref 135–145)
Total Bilirubin: 0.3 mg/dL (ref 0.3–1.2)
Total Protein: 7.4 g/dL (ref 6.5–8.1)

## 2020-10-07 LAB — POC OCCULT BLOOD, ED: Fecal Occult Bld: NEGATIVE

## 2020-10-07 LAB — TYPE AND SCREEN
ABO/RH(D): O POS
Antibody Screen: NEGATIVE

## 2020-10-07 LAB — LIPASE, BLOOD: Lipase: 26 U/L (ref 11–51)

## 2020-10-07 LAB — PROTIME-INR
INR: 1 (ref 0.8–1.2)
Prothrombin Time: 12.8 seconds (ref 11.4–15.2)

## 2020-10-07 MED ORDER — PANTOPRAZOLE SODIUM 40 MG IV SOLR
40.0000 mg | Freq: Once | INTRAVENOUS | Status: AC
  Administered 2020-10-07: 40 mg via INTRAVENOUS
  Filled 2020-10-07: qty 40

## 2020-10-07 MED ORDER — ONDANSETRON HCL 4 MG/2ML IJ SOLN
4.0000 mg | Freq: Once | INTRAMUSCULAR | Status: AC
  Administered 2020-10-07: 4 mg via INTRAVENOUS
  Filled 2020-10-07: qty 2

## 2020-10-07 MED ORDER — PANTOPRAZOLE SODIUM 40 MG PO TBEC: 40.0000 mg | DELAYED_RELEASE_TABLET | Freq: Every day | ORAL | 0 refills | Status: DC

## 2020-10-07 MED ORDER — IOHEXOL 300 MG/ML  SOLN
100.0000 mL | Freq: Once | INTRAMUSCULAR | Status: AC | PRN
  Administered 2020-10-07: 100 mL via INTRAVENOUS

## 2020-10-07 MED ORDER — SODIUM CHLORIDE 0.9 % IV BOLUS
1000.0000 mL | Freq: Once | INTRAVENOUS | Status: AC
  Administered 2020-10-07: 1000 mL via INTRAVENOUS

## 2020-10-07 MED ORDER — SODIUM CHLORIDE 0.9 % IV SOLN: INTRAVENOUS | Status: DC

## 2020-10-07 NOTE — ED Triage Notes (Addendum)
Pt reports abd pain x 8 days. Pt reports dark stool that started yesterday. Pt says TUMS helped the pain, then pain returned

## 2020-10-07 NOTE — ED Provider Notes (Signed)
[facility] EMERGENCY DEPARTMENT Provider Note   CSN: [id number] Arrival date & time: 10/07/20  0108     History Chief Complaint  Patient presents with   Abdominal Pain    Keith Hale is a 40 y.o. male.  Level 5 caveat for language barrier.  Translator is used.  Patient reports 8-day history of epigastric abdominal pain that is fairly constant.  Associate with some nausea but no vomiting.  Nothing makes the pain better or worse.  Tried Tums with partial relief.  Pain is similar to when he had an ulcer 2 years ago.  He has a history of GI bleed secondary to duodenal ulcer and H. pylori gastritis.  He is no longer on a PPI.  Denies any NSAID or alcohol use. States he developed dark stools about 1 day ago and is only happened 1 time.  Stools are well formed.  Denies any vomiting.  Denies any dizziness or lightheadedness.  No chest pain or shortness of breath. Has not had any issues like this since he was in the hospital 2 years ago  The history is provided by the patient. The history is limited by a language barrier. A language interpreter was used.  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   B12 deficiency 03/08/2019   Helicobacter pylori gastritis    Severe anemia 11/01/2018   Melena 11/01/2018   GI bleed due to NSAIDs 11/01/2018   GI bleeding 11/01/2018   Acute blood loss anemia     Past Surgical History:  Procedure Laterality Date   APPENDECTOMY     BIOPSY  11/01/2018   Procedure: BIOPSY;  Surgeon: West Bali, MD;  Location: [facility] ENDO SUITE;  Service: Endoscopy;;   ESOPHAGOGASTRODUODENOSCOPY (EGD) WITH PROPOFOL N/A 11/01/2018   Procedure: ESOPHAGOGASTRODUODENOSCOPY  (EGD) WITH PROPOFOL;  Surgeon: West Bali, MD;  Location: [facility] ENDO SUITE;  Service: Endoscopy;  Laterality: N/A;   TONSILLECTOMY         Family History  Problem Relation Age of Onset   Hypertension Mother    Colon cancer Neg Hx     Social History   Tobacco Use   Smoking status: Former    Packs/day: 0.00    Years: 5.00    Pack years: 0.00    Types: Cigarettes    Quit date: 04/21/2017    Years since quitting: 3.4   Smokeless tobacco: Never   Tobacco comments:    previously smoked about 2 cig/ month.  Vaping Use   Vaping Use: Never used  Substance Use Topics   Alcohol use: Yes    Comment: drinks every 2-3 weeks, about 10 beers at a time   Drug use: No    Home Medications Prior to Admission medications   Not on File    Allergies    Patient has no known allergies.  Review of Systems   Review of Systems  Constitutional:  Negative for activity change, appetite change, fatigue and fever.  HENT:  Negative for congestion and rhinorrhea.   Respiratory:  Negative for cough and shortness of breath.   Cardiovascular:  Negative for chest pain.  Gastrointestinal:  Positive for abdominal pain, blood  in stool and nausea. Negative for vomiting.  Genitourinary:  Negative for dysuria and hematuria.  Musculoskeletal:  Negative for arthralgias and myalgias.  Skin:  Negative for rash.  Neurological:  Negative for dizziness, weakness and headaches.   all other systems are negative except as noted in the HPI and PMH.   Physical Exam Updated Vital Signs BP 116/80   Pulse 60   Temp 98.6 F (37 C) (Oral)   Resp 18   Ht 5\' 9"  (1.753 m)   Wt 90 kg   SpO2 99%   BMI 29.30 kg/m   Physical Exam Vitals and nursing note reviewed.  Constitutional:      General: He is not in acute distress.    Appearance: He is well-developed.  HENT:     Head: Normocephalic and atraumatic.     Mouth/Throat:     Pharynx: No oropharyngeal exudate.  Eyes:     Conjunctiva/sclera: Conjunctivae  normal.     Pupils: Pupils are equal, round, and reactive to light.  Neck:     Comments: No meningismus. Cardiovascular:     Rate and Rhythm: Normal rate and regular rhythm.     Heart sounds: Normal heart sounds. No murmur heard. Pulmonary:     Effort: Pulmonary effort is normal. No respiratory distress.     Breath sounds: Normal breath sounds.  Abdominal:     Palpations: Abdomen is soft.     Tenderness: There is abdominal tenderness. There is no guarding or rebound.     Comments: Soft, mild diffuse tenderness worse in the epigastrium  Genitourinary:    Comments: Dark stool, no hemorrhoids or gross blood Musculoskeletal:        General: No tenderness. Normal range of motion.     Cervical back: Normal range of motion and neck supple.  Skin:    General: Skin is warm.  Neurological:     Mental Status: He is alert and oriented to person, place, and time.     Cranial Nerves: No cranial nerve deficit.     Motor: No abnormal muscle tone.     Coordination: Coordination normal.     Comments:  5/5 strength throughout. CN 2-12 intact.Equal grip strength.   Psychiatric:        Behavior: Behavior normal.    ED Results / Procedures / Treatments   Labs (all labs ordered are listed, but only abnormal results are displayed) Labs Reviewed  COMPREHENSIVE METABOLIC PANEL - Abnormal; Notable for the following components:      Result Value   Glucose, Bld 104 (*)    All other components within normal limits  CBC WITH DIFFERENTIAL/PLATELET  PROTIME-INR  LIPASE, BLOOD  POC OCCULT BLOOD, ED  TYPE AND SCREEN    EKG None  Radiology CT ABDOMEN PELVIS W CONTRAST  Result Date: 10/07/2020 CLINICAL DATA:  Abdominal pain radiating to right groin for 8 days. Dark stool. EXAM: CT ABDOMEN AND PELVIS WITH CONTRAST TECHNIQUE: Multidetector CT imaging of the abdomen and pelvis was performed using the standard protocol following bolus administration of intravenous contrast. CONTRAST:  10/09/2020 OMNIPAQUE  IOHEXOL 300 MG/ML  SOLN COMPARISON:  12/10/2014 FINDINGS: Lower Chest: No acute findings. Hepatobiliary: No hepatic masses identified. Gallbladder is unremarkable. No evidence of biliary ductal dilatation. Pancreas:  No mass or inflammatory changes. Spleen: Within normal limits in size and appearance. Adrenals/Urinary Tract: No masses identified. No evidence of ureteral calculi or hydronephrosis. Stomach/Bowel: No evidence of obstruction, inflammatory process or abnormal fluid collections. Vascular/Lymphatic: No pathologically enlarged lymph  nodes. No acute vascular findings. Reproductive:  No mass or other significant abnormality. Other:  None. Musculoskeletal:  No suspicious bone lesions identified. IMPRESSION: Negative. No acute findings or other significant abnormality. Electronically Signed   By: Danae Orleans M.D.   On: 10/07/2020 05:53    Procedures Procedures   Medications Ordered in ED Medications  sodium chloride 0.9 % bolus 1,000 mL (has no administration in time range)    And  0.9 %  sodium chloride infusion (has no administration in time range)  pantoprazole (PROTONIX) injection 40 mg (has no administration in time range)  ondansetron (ZOFRAN) injection 4 mg (has no administration in time range)    ED Course  I have reviewed the triage vital signs and the nursing notes.  Pertinent labs & imaging results that were available during my care of the patient were reviewed by me and considered in my medical decision making (see chart for details).    MDM Rules/Calculators/A&P                         8 days of epigastric abdominal pain with nausea.  1 day of dark stools.  History of duodenal ulcer.  Vitals are stable.  EGD in 2020 showed gastritis as well as duodenal ulcer with bleeding.  Hemoccult today is negative.  Hemoglobin is stable at 14.  Patient is stable, no distress.  CT scan today without acute abnormalities.  Patient with likely exacerbation of his gastritis again.  He  does not appear to be actively bleeding.  We will reinitiate PPI, follow-up with gastroenterology.  Avoid alcohol, caffeine, NSAID medications and spicy foods. Return precautions discussed.  Final Clinical Impression(s) / ED Diagnoses Final diagnoses:  Epigastric abdominal pain    Rx / DC Orders ED Discharge Orders     None        Nevaeh Korte, Jeannett Senior, MD 10/07/20 [phone]

## 2020-10-07 NOTE — Discharge Instructions (Addendum)
We suspect your stomach pain is coming from your gastritis and ulcers.  However there is no evidence of bleeding today.  You should restart the stomach medication and follow-up with the gastroenterologist.  Avoid alcohol, caffeine, NSAID medications such as ibuprofen or aspirin.  Return to the ED with worsening pain, fever, vomiting, rectal bleeding, any other concerns.

## 2020-10-08 ENCOUNTER — Encounter: Payer: Self-pay | Admitting: Internal Medicine

## 2020-11-21 ENCOUNTER — Ambulatory Visit (INDEPENDENT_AMBULATORY_CARE_PROVIDER_SITE_OTHER): Payer: Self-pay | Admitting: Internal Medicine

## 2020-11-21 ENCOUNTER — Telehealth: Payer: Self-pay | Admitting: *Deleted

## 2020-11-21 ENCOUNTER — Other Ambulatory Visit: Payer: Self-pay

## 2020-11-21 ENCOUNTER — Encounter: Payer: Self-pay | Admitting: Internal Medicine

## 2020-11-21 VITALS — BP 124/80 | HR 68 | Temp 97.7°F | Ht 68.0 in | Wt 195.0 lb

## 2020-11-21 DIAGNOSIS — K921 Melena: Secondary | ICD-10-CM

## 2020-11-21 DIAGNOSIS — K219 Gastro-esophageal reflux disease without esophagitis: Secondary | ICD-10-CM

## 2020-11-21 DIAGNOSIS — G8929 Other chronic pain: Secondary | ICD-10-CM

## 2020-11-21 DIAGNOSIS — R1013 Epigastric pain: Secondary | ICD-10-CM

## 2020-11-21 MED ORDER — PANTOPRAZOLE SODIUM 40 MG PO TBEC: 40.0000 mg | DELAYED_RELEASE_TABLET | Freq: Every day | ORAL | 5 refills | Status: DC

## 2020-11-21 NOTE — Telephone Encounter (Signed)
Called pt. He has been scheduled for EGD with propofol ASA 1 Dr. Marletta Lor 8/19 at 9:00am, arrival 7:30am. Aware of EGD instructions and will mail also.

## 2020-11-21 NOTE — H&P (View-Only) (Signed)
Primary Care Physician:  Pcp, No Primary Gastroenterologist:  Dr. Marletta Lor  Chief Complaint  Patient presents with   Abdominal Pain    Abd pain since June, constipation    HPI:   Keith Hale is a 40 y.o. male who presents to the clinic today with multiple GI complaints.  Last seen in 2020.  He has a history of duodenal ulcer with visible vessel and subsequent GI bleed and acute blood loss anemia July 2020 treated with epinephrine and hemin spray.  At that time he was taking ASA powders.  Biopsies consistent with H. pylori gastritis.  He was treated with antibiotics with subsequent negative eradication testing by stool antigen in November 2020.  States he was doing well until approximately 2 to 3 months ago.  Notes worsening epigastric pain.  Moderate in severity, does not radiate.  Pain is constant.  Does note some dark stools as well.  No unintentional weight loss.  No chronic NSAID use.  Past Medical History:  Diagnosis Date   GI hemorrhage 11/01/2018    Past Surgical History:  Procedure Laterality Date   APPENDECTOMY     BIOPSY  11/01/2018   Procedure: BIOPSY;  Surgeon: West Bali, MD;  Location: [facility];  Service: Endoscopy;;   ESOPHAGOGASTRODUODENOSCOPY (EGD) WITH PROPOFOL N/A 11/01/2018   Procedure: ESOPHAGOGASTRODUODENOSCOPY (EGD) WITH PROPOFOL;  Surgeon: West Bali, MD;  Location: [facility];  Service: Endoscopy;  Laterality: N/A;   TONSILLECTOMY      Current Outpatient Medications  Medication Sig Dispense Refill   pantoprazole (PROTONIX) 40 MG tablet Take 1 tablet (40 mg total) by mouth daily. 90 tablet 5   pantoprazole (PROTONIX) 40 MG tablet Take 1 tablet (40 mg total) by mouth daily. (Patient not taking: Reported on 11/21/2020) 30 tablet 0   No current facility-administered medications for this visit.    Allergies as of 11/21/2020   (No Known Allergies)    Family History  Problem Relation Age of Onset   Hypertension Mother    Colon cancer Neg  Hx     Social History   Socioeconomic History   Marital status: Married    Spouse name: Not on file   Number of children: Not on file   Years of education: Not on file   Highest education level: Not on file  Occupational History   Not on file  Tobacco Use   Smoking status: Former    Packs/day: 0.00    Years: 5.00    Pack years: 0.00    Types: Cigarettes    Quit date: 04/21/2017    Years since quitting: 3.5   Smokeless tobacco: Never   Tobacco comments:    previously smoked about 2 cig/ month.  Vaping Use   Vaping Use: Never used  Substance and Sexual Activity   Alcohol use: Not Currently   Drug use: No   Sexual activity: Not on file  Other Topics Concern   Not on file  Social History Narrative   Not on file   Social Determinants of Health   Financial Resource Strain: Not on file  Food Insecurity: Not on file  Transportation Needs: Not on file  Physical Activity: Not on file  Stress: Not on file  Social Connections: Not on file  Intimate Partner Violence: Not on file    Subjective: Review of Systems  Constitutional:  Negative for chills and fever.  HENT:  Negative for congestion and hearing loss.   Eyes:  Negative for  blurred vision and double vision.  Respiratory:  Negative for cough and shortness of breath.   Cardiovascular:  Negative for chest pain and palpitations.  Gastrointestinal:  Positive for abdominal pain and heartburn. Negative for blood in stool, constipation, diarrhea, melena and vomiting.  Genitourinary:  Negative for dysuria and urgency.  Musculoskeletal:  Negative for joint pain and myalgias.  Skin:  Negative for itching and rash.  Neurological:  Negative for dizziness and headaches.  Psychiatric/Behavioral:  Negative for depression. The patient is not nervous/anxious.       Objective: BP 124/80   Pulse 68   Temp 97.7 F (36.5 C) (Temporal)   Ht 5\' 8"  (1.727 m)   Wt 195 lb (88.5 kg)   BMI 29.65 kg/m  Physical Exam Constitutional:       Appearance: Normal appearance.  HENT:     Head: Normocephalic and atraumatic.  Eyes:     Extraocular Movements: Extraocular movements intact.     Conjunctiva/sclera: Conjunctivae normal.  Cardiovascular:     Rate and Rhythm: Normal rate and regular rhythm.  Pulmonary:     Effort: Pulmonary effort is normal.     Breath sounds: Normal breath sounds.  Abdominal:     General: Bowel sounds are normal.     Palpations: Abdomen is soft.  Musculoskeletal:        General: Normal range of motion.     Cervical back: Normal range of motion and neck supple.  Skin:    General: Skin is warm.  Neurological:     General: No focal deficit present.     Mental Status: He is alert and oriented to person, place, and time.  Psychiatric:        Mood and Affect: Mood normal.        Behavior: Behavior normal.     Assessment: *Epigastric pain *Acid reflux *Melena *History of H. pylori status posttreatment and negative eradication testing 2020 *History of duodenal ulcer with bleed  Plan: Will schedule for EGD to evaluate for peptic ulcer disease, esophagitis, gastritis, H. Pylori, duodenitis, or other. Will also evaluate for esophageal stricture, Schatzki's ring, esophageal web or other.   The risks including infection, bleed, or perforation as well as benefits, limitations, alternatives and imponderables have been reviewed with the patient. Potential for esophageal dilation, biopsy, etc. have also been reviewed.  Questions have been answered. All parties agreeable.  Continue on pantoprazole.  Prescription refilled today.  Avoid NSAIDs.  Further recommendations to follow   11/21/2020 11:10 AM   Disclaimer: This note was dictated with voice recognition software. Similar sounding words can inadvertently be transcribed and may not be corrected upon review.

## 2020-11-21 NOTE — Patient Instructions (Signed)
Realizaremos una endoscopia para evaluar ms a fondo su dolor epigstrico. Quiero que contine tomando pantoprazol 40 mg al [employer]. Este medicamento funciona mejor si lo toma 30 minutos antes del desayuno.  Mucho gusto   Dr. Marletta Lor

## 2020-11-21 NOTE — Progress Notes (Signed)
  Primary Care Physician:  Pcp, No Primary Gastroenterologist:  Dr. Jasaun Carn  Chief Complaint  Patient presents with   Abdominal Pain    Abd pain since June, constipation    HPI:   Keith Hale is a 39 y.o. male who presents to the clinic today with multiple GI complaints.  Last seen in 2020.  He has a history of duodenal ulcer with visible vessel and subsequent GI bleed and acute blood loss anemia July 2020 treated with epinephrine and hemin spray.  At that time he was taking ASA powders.  Biopsies consistent with H. pylori gastritis.  He was treated with antibiotics with subsequent negative eradication testing by stool antigen in November 2020.  States he was doing well until approximately 2 to 3 months ago.  Notes worsening epigastric pain.  Moderate in severity, does not radiate.  Pain is constant.  Does note some dark stools as well.  No unintentional weight loss.  No chronic NSAID use.  Past Medical History:  Diagnosis Date   GI hemorrhage 11/01/2018    Past Surgical History:  Procedure Laterality Date   APPENDECTOMY     BIOPSY  11/01/2018   Procedure: BIOPSY;  Surgeon: Fields, Sandi L, MD;  Location: [facility];  Service: Endoscopy;;   ESOPHAGOGASTRODUODENOSCOPY (EGD) WITH PROPOFOL N/A 11/01/2018   Procedure: ESOPHAGOGASTRODUODENOSCOPY (EGD) WITH PROPOFOL;  Surgeon: Fields, Sandi L, MD;  Location: [facility];  Service: Endoscopy;  Laterality: N/A;   TONSILLECTOMY      Current Outpatient Medications  Medication Sig Dispense Refill   pantoprazole (PROTONIX) 40 MG tablet Take 1 tablet (40 mg total) by mouth daily. 90 tablet 5   pantoprazole (PROTONIX) 40 MG tablet Take 1 tablet (40 mg total) by mouth daily. (Patient not taking: Reported on 11/21/2020) 30 tablet 0   No current facility-administered medications for this visit.    Allergies as of 11/21/2020   (No Known Allergies)    Family History  Problem Relation Age of Onset   Hypertension Mother    Colon cancer Neg  Hx     Social History   Socioeconomic History   Marital status: Married    Spouse name: Not on file   Number of children: Not on file   Years of education: Not on file   Highest education level: Not on file  Occupational History   Not on file  Tobacco Use   Smoking status: Former    Packs/day: 0.00    Years: 5.00    Pack years: 0.00    Types: Cigarettes    Quit date: 04/21/2017    Years since quitting: 3.5   Smokeless tobacco: Never   Tobacco comments:    previously smoked about 2 cig/ month.  Vaping Use   Vaping Use: Never used  Substance and Sexual Activity   Alcohol use: Not Currently   Drug use: No   Sexual activity: Not on file  Other Topics Concern   Not on file  Social History Narrative   Not on file   Social Determinants of Health   Financial Resource Strain: Not on file  Food Insecurity: Not on file  Transportation Needs: Not on file  Physical Activity: Not on file  Stress: Not on file  Social Connections: Not on file  Intimate Partner Violence: Not on file    Subjective: Review of Systems  Constitutional:  Negative for chills and fever.  HENT:  Negative for congestion and hearing loss.   Eyes:  Negative for   blurred vision and double vision.  Respiratory:  Negative for cough and shortness of breath.   Cardiovascular:  Negative for chest pain and palpitations.  Gastrointestinal:  Positive for abdominal pain and heartburn. Negative for blood in stool, constipation, diarrhea, melena and vomiting.  Genitourinary:  Negative for dysuria and urgency.  Musculoskeletal:  Negative for joint pain and myalgias.  Skin:  Negative for itching and rash.  Neurological:  Negative for dizziness and headaches.  Psychiatric/Behavioral:  Negative for depression. The patient is not nervous/anxious.       Objective: BP 124/80   Pulse 68   Temp 97.7 F (36.5 C) (Temporal)   Ht 5\' 8"  (1.727 m)   Wt 195 lb (88.5 kg)   BMI 29.65 kg/m  Physical Exam Constitutional:       Appearance: Normal appearance.  HENT:     Head: Normocephalic and atraumatic.  Eyes:     Extraocular Movements: Extraocular movements intact.     Conjunctiva/sclera: Conjunctivae normal.  Cardiovascular:     Rate and Rhythm: Normal rate and regular rhythm.  Pulmonary:     Effort: Pulmonary effort is normal.     Breath sounds: Normal breath sounds.  Abdominal:     General: Bowel sounds are normal.     Palpations: Abdomen is soft.  Musculoskeletal:        General: Normal range of motion.     Cervical back: Normal range of motion and neck supple.  Skin:    General: Skin is warm.  Neurological:     General: No focal deficit present.     Mental Status: He is alert and oriented to person, place, and time.  Psychiatric:        Mood and Affect: Mood normal.        Behavior: Behavior normal.     Assessment: *Epigastric pain *Acid reflux *Melena *History of H. pylori status posttreatment and negative eradication testing 2020 *History of duodenal ulcer with bleed  Plan: Will schedule for EGD to evaluate for peptic ulcer disease, esophagitis, gastritis, H. Pylori, duodenitis, or other. Will also evaluate for esophageal stricture, Schatzki's ring, esophageal web or other.   The risks including infection, bleed, or perforation as well as benefits, limitations, alternatives and imponderables have been reviewed with the patient. Potential for esophageal dilation, biopsy, etc. have also been reviewed.  Questions have been answered. All parties agreeable.  Continue on pantoprazole.  Prescription refilled today.  Avoid NSAIDs.  Further recommendations to follow   11/21/2020 11:10 AM   Disclaimer: This note was dictated with voice recognition software. Similar sounding words can inadvertently be transcribed and may not be corrected upon review.

## 2020-12-07 ENCOUNTER — Ambulatory Visit (HOSPITAL_COMMUNITY)
Admission: RE | Admit: 2020-12-07 | Discharge: 2020-12-07 | Disposition: A | Discharge status: Home / Self Care | Payer: Self-pay | Source: Ambulatory Visit | Attending: Internal Medicine | Admitting: Internal Medicine

## 2020-12-07 ENCOUNTER — Ambulatory Visit (HOSPITAL_COMMUNITY): Payer: Self-pay | Admitting: Anesthesiology

## 2020-12-07 ENCOUNTER — Other Ambulatory Visit: Payer: Self-pay

## 2020-12-07 ENCOUNTER — Encounter (HOSPITAL_COMMUNITY)
Admission: RE | Disposition: A | Discharge status: Home / Self Care | Payer: Self-pay | Source: Ambulatory Visit | Attending: Internal Medicine

## 2020-12-07 ENCOUNTER — Encounter (HOSPITAL_COMMUNITY): Payer: Self-pay

## 2020-12-07 DIAGNOSIS — Z79899 Other long term (current) drug therapy: Secondary | ICD-10-CM | POA: Insufficient documentation

## 2020-12-07 DIAGNOSIS — Z87891 Personal history of nicotine dependence: Secondary | ICD-10-CM | POA: Insufficient documentation

## 2020-12-07 DIAGNOSIS — K219 Gastro-esophageal reflux disease without esophagitis: Secondary | ICD-10-CM | POA: Insufficient documentation

## 2020-12-07 DIAGNOSIS — Z8711 Personal history of peptic ulcer disease: Secondary | ICD-10-CM | POA: Insufficient documentation

## 2020-12-07 DIAGNOSIS — Z8249 Family history of ischemic heart disease and other diseases of the circulatory system: Secondary | ICD-10-CM | POA: Insufficient documentation

## 2020-12-07 DIAGNOSIS — K5900 Constipation, unspecified: Secondary | ICD-10-CM | POA: Insufficient documentation

## 2020-12-07 DIAGNOSIS — K2970 Gastritis, unspecified, without bleeding: Secondary | ICD-10-CM | POA: Insufficient documentation

## 2020-12-07 DIAGNOSIS — R1013 Epigastric pain: Secondary | ICD-10-CM | POA: Insufficient documentation

## 2020-12-07 HISTORY — PX: BIOPSY: SHX5522

## 2020-12-07 HISTORY — PX: ESOPHAGOGASTRODUODENOSCOPY (EGD) WITH PROPOFOL: SHX5813

## 2020-12-07 SURGERY — ESOPHAGOGASTRODUODENOSCOPY (EGD) WITH PROPOFOL
Anesthesia: General

## 2020-12-07 MED ORDER — LACTATED RINGERS IV SOLN: INTRAVENOUS | Status: DC

## 2020-12-07 MED ORDER — PROPOFOL 10 MG/ML IV BOLUS
INTRAVENOUS | Status: DC | PRN
  Administered 2020-12-07: 100 mg via INTRAVENOUS
  Administered 2020-12-07: 150 ug/kg/min via INTRAVENOUS

## 2020-12-07 MED ORDER — PANTOPRAZOLE SODIUM 40 MG PO TBEC: 40.0000 mg | DELAYED_RELEASE_TABLET | Freq: Two times a day (BID) | ORAL | 5 refills | Status: DC

## 2020-12-07 MED ORDER — LIDOCAINE HCL (CARDIAC) PF 100 MG/5ML IV SOSY
PREFILLED_SYRINGE | INTRAVENOUS | Status: DC | PRN
  Administered 2020-12-07: 100 mg via INTRATRACHEAL

## 2020-12-07 NOTE — Op Note (Addendum)
[facility] Patient Name: Keith Hale Procedure Date: 12/07/2020 8:47 AM MRN: [id number] Date of Birth: 02-Mar-1981 Attending MD: Elon Alas. Abbey Chatters DO CSN: [id number] Age: 40 Admit Type: Outpatient Procedure:                Upper GI endoscopy Indications:              Epigastric abdominal pain, Heartburn Providers:                Elon Alas. Abbey Chatters, DO, Crystal Page, Randa Spike, Technician Referring MD:              Medicines:                See the Anesthesia note for documentation of the                            administered medications Complications:            No immediate complications. Estimated Blood Loss:     Estimated blood loss was minimal. Procedure:                Pre-Anesthesia Assessment:                           - The anesthesia plan was to use monitored                            anesthesia care (MAC).                           After obtaining informed consent, the endoscope was                            passed under direct vision. Throughout the                            procedure, the patient's blood pressure, pulse, and                            oxygen saturations were monitored continuously. The                            [id number]-H190 ([id number]) scope was introduced through the                            mouth, and advanced to the second part of duodenum.                            The upper GI endoscopy was accomplished without                            difficulty. The patient tolerated the procedure                            well. Scope In: 8:54:13 AM  Scope Out: 8:57:19 AM Total Procedure Duration: 0 hours 3 minutes 6 seconds  Findings:      There is no endoscopic evidence of bleeding, areas of erosion,       esophagitis, hiatal hernia, ulcerations or varices in the entire       esophagus.      Localized mild inflammation characterized by linear erosions was found       in the gastric antrum. Biopsies were taken with a  cold forceps for       Helicobacter pylori testing.      The duodenal bulb, first portion of the duodenum and second portion of       the duodenum were normal. Impression:               - Gastritis. Biopsied.                           - Normal duodenal bulb, first portion of the                            duodenum and second portion of the duodenum. Moderate Sedation:      Per Anesthesia Care Recommendation:           - Patient has a contact number available for                            emergencies. The signs and symptoms of potential                            delayed complications were discussed with the                            patient. Return to normal activities tomorrow.                            Written discharge instructions were provided to the                            patient.                           - Resume previous diet.                           - Continue present medications.                           - Await pathology results.                           - Return to GI clinic in 3 months.                           - Use Protonix (pantoprazole) 40 mg PO BID for 12                            weeks then decrease down to once daily.                           -  Consider HIDA scan to work up for gallbladder                            dysfunction Procedure Code(s):        --- Professional ---                           [phone], Esophagogastroduodenoscopy, flexible,                            transoral; with biopsy, single or multiple Diagnosis Code(s):        --- Professional ---                           K29.70, Gastritis, unspecified, without bleeding                           R10.13, Epigastric pain                           R12, Heartburn CPT copyright 2019 American Medical Association. All rights reserved. The codes documented in this report are preliminary and upon coder review may  be revised to meet current compliance requirements. Elon Alas. Abbey Chatters, DO [place] Abbey Chatters, DO 12/07/2020 9:03:55 AM This report has been signed electronically. Number of Addenda: 0

## 2020-12-07 NOTE — Interval H&P Note (Signed)
History and Physical Interval Note:  12/07/2020 8:45 AM  Keith Hale  has presented today for surgery, with the diagnosis of melena, gerd, epigastric pain.  The various methods of treatment have been discussed with the patient and family. After consideration of risks, benefits and other options for treatment, the patient has consented to  Procedure(s) with comments: ESOPHAGOGASTRODUODENOSCOPY (EGD) WITH PROPOFOL (N/A) - 9:00am as a surgical intervention.  The patient's history has been reviewed, patient examined, no change in status, stable for surgery.  I have reviewed the patient's chart and labs.  Questions were answered to the patient's satisfaction.     Lanelle Bal

## 2020-12-07 NOTE — Transfer of Care (Signed)
Immediate Anesthesia Transfer of Care Note  Patient: Keith Hale  Procedure(s) Performed: ESOPHAGOGASTRODUODENOSCOPY (EGD) WITH PROPOFOL BIOPSY  Patient Location: Endoscopy Unit  Anesthesia Type:General  Level of Consciousness: drowsy, patient cooperative and responds to stimulation  Airway & Oxygen Therapy: Patient Spontanous Breathing  Post-op Assessment: Report given to RN, Post -op Vital signs reviewed and stable and Patient moving all extremities X 4  Post vital signs: Reviewed and stable  Last Vitals:  Vitals Value Taken Time  BP    Temp    Pulse    Resp    SpO2      Last Pain:  Vitals:   12/07/20 0850  TempSrc:   PainSc: 0-No pain      Patients Stated Pain Goal: 7 (12/07/20 0747)  Complications: No notable events documented.

## 2020-12-07 NOTE — Progress Notes (Signed)
[employer] interpreter Hubert Azure [phone] used for pre-procedure nursing assessment.

## 2020-12-07 NOTE — Anesthesia Preprocedure Evaluation (Addendum)
Anesthesia Evaluation  Patient identified by MRN, date of birth, ID band Patient awake    Reviewed: Allergy & Precautions, NPO status , Patient's Chart, lab work & pertinent test results  History of Anesthesia Complications Negative for: history of anesthetic complications  Airway Mallampati: II  TM Distance: >3 FB Neck ROM: Full    Dental  (+) Dental Advisory Given, Teeth Intact Cavities :   Pulmonary former smoker,    Pulmonary exam normal breath sounds clear to auscultation       Cardiovascular Exercise Tolerance: Good Normal cardiovascular exam Rhythm:Regular Rate:Normal     Neuro/Psych negative neurological ROS  negative psych ROS   GI/Hepatic Neg liver ROS, PUD, neg GERD  Medicated,  Endo/Other  negative endocrine ROS  Renal/GU negative Renal ROS     Musculoskeletal negative musculoskeletal ROS (+)   Abdominal   Peds  Hematology  (+) anemia ,   Anesthesia Other Findings Snoring   Reproductive/Obstetrics negative OB ROS                          Anesthesia Physical Anesthesia Plan  ASA: 2  Anesthesia Plan: General   Post-op Pain Management:    Induction: Intravenous  PONV Risk Score and Plan: Propofol infusion  Airway Management Planned: Nasal Cannula and Natural Airway  Additional Equipment:   Intra-op Plan:   Post-operative Plan:   Informed Consent: I have reviewed the patients History and Physical, chart, labs and discussed the procedure including the risks, benefits and alternatives for the proposed anesthesia with the patient or authorized representative who has indicated his/her understanding and acceptance.     Dental advisory given  Plan Discussed with: CRNA and Surgeon  Anesthesia Plan Comments:         Anesthesia Quick Evaluation

## 2020-12-07 NOTE — Discharge Instructions (Addendum)
EGD Discharge instructions Please read the instructions outlined below and refer to this sheet in the next few weeks. These discharge instructions provide you with general information on caring for yourself after you leave the hospital. Your doctor may also give you specific instructions. While your treatment has been planned according to the most current medical practices available, unavoidable complications occasionally occur. If you have any problems or questions after discharge, please call your doctor. ACTIVITY You may resume your regular activity but move at a slower pace for the next 24 hours.  Take frequent rest periods for the next 24 hours.  Walking will help expel (get rid of) the air and reduce the bloated feeling in your abdomen.  No driving for 24 hours (because of the anesthesia (medicine) used during the test).  You may shower.  Do not sign any important legal documents or operate any machinery for 24 hours (because of the anesthesia used during the test).  NUTRITION Drink plenty of fluids.  You may resume your normal diet.  Begin with a light meal and progress to your normal diet.  Avoid alcoholic beverages for 24 hours or as instructed by your caregiver.  MEDICATIONS You may resume your normal medications unless your caregiver tells you otherwise.  WHAT YOU CAN EXPECT TODAY You may experience abdominal discomfort such as a feeling of fullness or "gas" pains.  FOLLOW-UP Your doctor will discuss the results of your test with you.  SEEK IMMEDIATE MEDICAL ATTENTION IF ANY OF THE FOLLOWING OCCUR: Excessive nausea (feeling sick to your stomach) and/or vomiting.  Severe abdominal pain and distention (swelling).  Trouble swallowing.  Temperature over 101 F (37.8 C).  Rectal bleeding or vomiting of blood.   Your EGD revealed a moderate amount of inflammation in her stomach.  I took biopsies of this to rule out infection with a bacteria called H. pylori.  Await pathology  results, my office will contact you.  I want you to increase your pantoprazole to twice daily for the next 12 weeks and then you can decrease back down to once daily thereafter.  Avoid NSAIDs.  Follow-up with GI in 3 months with any extender.  Call office on Monday to schedule this appointment [phone]). We may need to consider working up your gallbladder.   I hope you have a great rest of your week!  Hennie Duos. Marletta Lor, D.O. Gastroenterology and Hepatology [facility]

## 2020-12-07 NOTE — Anesthesia Postprocedure Evaluation (Signed)
Anesthesia Post Note  Patient: Keith Hale  Procedure(s) Performed: ESOPHAGOGASTRODUODENOSCOPY (EGD) WITH PROPOFOL BIOPSY  Patient location during evaluation: Endoscopy Anesthesia Type: General Level of consciousness: awake and alert and oriented Pain management: pain level controlled Vital Signs Assessment: post-procedure vital signs reviewed and stable Respiratory status: spontaneous breathing and respiratory function stable Cardiovascular status: blood pressure returned to baseline and stable Postop Assessment: no apparent nausea or vomiting Anesthetic complications: no   No notable events documented.   Last Vitals:  Vitals:   12/07/20 0747 12/07/20 0900  BP: 118/77 100/68  Pulse: (!) 59   Resp: 19 20  Temp: 36.7 C 36.7 C  SpO2: 96% 95%    Last Pain:  Vitals:   12/07/20 0900  TempSrc: Oral  PainSc: 0-No pain                 Ishaan Villamar C Mitul Hallowell

## 2020-12-10 LAB — SURGICAL PATHOLOGY

## 2020-12-12 NOTE — Progress Notes (Signed)
24 de agosto de 2022   Estimado Keith Hale, Las biopsias extradas durante su EGD mostraron inflamacin en el estmago pero negativas para H Pylori. Contine tomando [employer] al da su medicamento PPI (pantoprazol) durante 8 a 12 semanas y luego disminuya a Medical sales representative. Evite los [place].   Seguimiento con GI segn lo programado anteriormente. Si tiene alguna pregunta o inquietud, llame a la oficina al [phone].    [name], CMA

## 2020-12-18 ENCOUNTER — Encounter (HOSPITAL_COMMUNITY): Payer: Self-pay | Admitting: Internal Medicine

## 2021-02-25 ENCOUNTER — Encounter: Payer: Self-pay | Admitting: Internal Medicine

## 2021-07-25 ENCOUNTER — Ambulatory Visit (INDEPENDENT_AMBULATORY_CARE_PROVIDER_SITE_OTHER): Payer: Self-pay | Admitting: Internal Medicine

## 2021-07-25 ENCOUNTER — Other Ambulatory Visit: Payer: Self-pay | Admitting: Internal Medicine

## 2021-07-25 ENCOUNTER — Encounter: Payer: Self-pay | Admitting: *Deleted

## 2021-07-25 ENCOUNTER — Ambulatory Visit: Payer: Self-pay | Admitting: Internal Medicine

## 2021-07-25 VITALS — BP 104/60 | HR 64 | Temp 97.1°F | Ht 69.0 in | Wt 198.6 lb

## 2021-07-25 DIAGNOSIS — K219 Gastro-esophageal reflux disease without esophagitis: Secondary | ICD-10-CM

## 2021-07-25 DIAGNOSIS — R1013 Epigastric pain: Secondary | ICD-10-CM

## 2021-07-25 DIAGNOSIS — G8929 Other chronic pain: Secondary | ICD-10-CM

## 2021-07-25 DIAGNOSIS — R110 Nausea: Secondary | ICD-10-CM

## 2021-07-25 MED ORDER — PANTOPRAZOLE SODIUM 40 MG PO TBEC: 40.0000 mg | DELAYED_RELEASE_TABLET | Freq: Two times a day (BID) | ORAL | 5 refills | Status: DC

## 2021-07-25 MED ORDER — SUCRALFATE 1 G PO TABS: 1.0000 g | ORAL_TABLET | Freq: Three times a day (TID) | ORAL | 2 refills | Status: DC

## 2021-07-25 NOTE — Progress Notes (Signed)
? ? ?Primary Care Physician:  Pcp, No ?Primary Gastroenterologist:  Dr. Abbey Chatters ? ?Chief Complaint  ?Patient presents with  ? Abdominal Pain  ?  Pain after eating ,mid abdominal pain with radiating to both sides with nausea. Some constipation noted  ? ? ?HPI:   ?Keith Hale is a 41 y.o. male who presents to the clinic today with multiple GI complaints.   He has a history of duodenal ulcer with visible vessel and subsequent GI bleed and acute blood loss anemia July 2020 treated with epinephrine and hemin spray.  At that time he was taking ASA powders.  Biopsies consistent with H. pylori gastritis.  He was treated with antibiotics with subsequent negative eradication testing by stool antigen in November 2020.   ? ?Came to our office 11/21/2020 with worsening epigastric discomfort.  Repeat EGD 12/07/2020 unremarkable besides H. pylori negative gastritis.  I increased his pantoprazole to twice daily after that study. ? ?States he has been doing very well until approximately 1 week ago when he had sudden onset epigastric pain.  Moderate in severity, intermittent.  Radiates to his right upper quadrant and left upper quadrant. ? ?Also with associated nausea especially after meals.  Had a CT abdomen pelvis with contrast 10/07/2020 which was largely unremarkable.  Denies any melena hematochezia.  Does note 1 bowel movement on average per day. ?Past Medical History:  ?Diagnosis Date  ? GI hemorrhage 11/01/2018  ? ? ?Past Surgical History:  ?Procedure Laterality Date  ? APPENDECTOMY    ? BIOPSY  11/01/2018  ? Procedure: BIOPSY;  Surgeon: Danie Binder, MD;  [facility];  Service: Endoscopy;;  ? BIOPSY  12/07/2020  ? Procedure: BIOPSY;  Surgeon: Eloise Harman, DO;  Location: [facility];  Service: Endoscopy;;  ? ESOPHAGOGASTRODUODENOSCOPY (EGD) WITH PROPOFOL N/A 11/01/2018  ? Procedure: ESOPHAGOGASTRODUODENOSCOPY (EGD) WITH PROPOFOL;  Surgeon: Danie Binder, MD;  [facility]: [facility];  Service: Endoscopy;   Laterality: N/A;  ? ESOPHAGOGASTRODUODENOSCOPY (EGD) WITH PROPOFOL N/A 12/07/2020  ? Procedure: ESOPHAGOGASTRODUODENOSCOPY (EGD) WITH PROPOFOL;  Surgeon: Eloise Harman, DO;  [facility]: [facility];  Service: Endoscopy;  Laterality: N/A;  9:00am  ? TONSILLECTOMY    ? ? ?Current Outpatient Medications  ?Medication Sig Dispense Refill  ? sucralfate (CARAFATE) 1 g tablet Take 1 tablet (1 g total) by mouth 4 (four) times daily -  with meals and at bedtime. 120 tablet 2  ? Omega-3 1000 MG CAPS Take 1,000 mg by mouth daily.    ? pantoprazole (PROTONIX) 40 MG tablet Take 1 tablet (40 mg total) by mouth 2 (two) times daily before a meal. 60 tablet 5  ? ?No current facility-administered medications for this visit.  ? ? ?Allergies as of 07/25/2021  ? (No Known Allergies)  ? ? ?Family History  ?Problem Relation Age of Onset  ? Hypertension Mother   ? Colon cancer Neg Hx   ? ? ?Social History  ? ?Socioeconomic History  ? Marital status: Married  ?  Spouse name: Not on file  ? Number of children: Not on file  ? Years of education: Not on file  ? Highest education level: Not on file  ?Occupational History  ? Not on file  ?Tobacco Use  ? Smoking status: Former  ?  Packs/day: 0.00  ?  Years: 5.00  ?  Pack years: 0.00  ?  Types: Cigarettes  ?  Quit date: 04/21/2017  ?  Years since quitting: 4.2  ? Smokeless  tobacco: Never  ? Tobacco comments:  ?  previously smoked about 2 cig/ month.  ?Vaping Use  ? Vaping Use: Never used  ?Substance and Sexual Activity  ? Alcohol use: Not Currently  ? Drug use: No  ? Sexual activity: Not on file  ?Other Topics Concern  ? Not on file  ?Social History Narrative  ? Not on file  ? ?Social Determinants of Health  ? ?Financial Resource Strain: Not on file  ?Food Insecurity: Not on file  ?Transportation Needs: Not on file  ?Physical Activity: Not on file  ?Stress: Not on file  ?Social Connections: Not on file  ?Intimate Partner Violence: Not on file  ? ? ?Subjective: ?Review of Systems  ?Constitutional:   Negative for chills and fever.  ?HENT:  Negative for congestion and hearing loss.   ?Eyes:  Negative for blurred vision and double vision.  ?Respiratory:  Negative for cough and shortness of breath.   ?Cardiovascular:  Negative for chest pain and palpitations.  ?Gastrointestinal:  Positive for abdominal pain and heartburn. Negative for blood in stool, constipation, diarrhea, melena and vomiting.  ?Genitourinary:  Negative for dysuria and urgency.  ?Musculoskeletal:  Negative for joint pain and myalgias.  ?Skin:  Negative for itching and rash.  ?Neurological:  Negative for dizziness and headaches.  ?Psychiatric/Behavioral:  Negative for depression. The patient is not nervous/anxious.    ? ? ? ?Objective: ?BP 104/60   Pulse 64   Temp (!) 97.1 ?F (36.2 ?C)   Ht 5\' 9"  (1.753 m)   Wt 198 lb 9.6 oz (90.1 kg)   BMI 29.33 kg/m?  ?Physical Exam ?Constitutional:   ?   Appearance: Normal appearance.  ?HENT:  ?   Head: Normocephalic and atraumatic.  ?Eyes:  ?   Extraocular Movements: Extraocular movements intact.  ?   Conjunctiva/sclera: Conjunctivae normal.  ?Cardiovascular:  ?   Rate and Rhythm: Normal rate and regular rhythm.  ?Pulmonary:  ?   Effort: Pulmonary effort is normal.  ?   Breath sounds: Normal breath sounds.  ?Abdominal:  ?   General: Bowel sounds are normal.  ?   Palpations: Abdomen is soft.  ?Musculoskeletal:     ?   General: Normal range of motion.  ?   Cervical back: Normal range of motion and neck supple.  ?Skin: ?   General: Skin is warm.  ?Neurological:  ?   General: No focal deficit present.  ?   Mental Status: He is alert and oriented to person, place, and time.  ?Psychiatric:     ?   Mood and Affect: Mood normal.     ?   Behavior: Behavior normal.  ? ? ? ?Assessment: ?*Epigastric pain ?*GERD-well-controlled on pantoprazole ?*Nausea ?*History of H. pylori status posttreatment and negative eradication testing 2020 ?*History of duodenal ulcer with bleed ? ?Plan: ?Etiology of patient's new onset pain  unclear.  Was doing well on PPI twice daily until approximately 1 week ago.  No NSAID use. ? ?We will check CBC, CMP, lipase. ? ?Right upper quadrant ultrasound ordered to rule out biliary colic. ? ?Continue on pantoprazole twice daily.  I will send in Carafate to take up to 4 times a day as needed. ? ?Follow-up with GI in 3 months. ? ? ?07/25/2021 1:26 PM ? ? ?Disclaimer: This note was dictated with voice recognition software. Similar sounding words can inadvertently be transcribed and may not be corrected upon review. ? ?

## 2021-07-25 NOTE — Patient Instructions (Addendum)
No s? lo que est? causando su dolor abdominal. ? ?Ordenar? ultrasonido para evaluar su ves?cula biliar. ? ?Ordenar? que se realice un an?lisis de Pension scheme manager. ? ?Ordenar? un nuevo medicamento para tomar hasta 4 veces al d?a llamado sucralfate. ? ?Continuar con pantoprazol. Recargar? hoy. ? ?Seguimiento en la cl?nica GI en 2 a 3 meses. ? ?Dr. Marletta Lor  ?

## 2021-07-26 ENCOUNTER — Ambulatory Visit (HOSPITAL_COMMUNITY)
Admission: RE | Admit: 2021-07-26 | Discharge: 2021-07-26 | Disposition: A | Discharge status: Home / Self Care | Payer: Self-pay | Source: Ambulatory Visit | Attending: Internal Medicine | Admitting: Internal Medicine

## 2021-07-26 DIAGNOSIS — G8929 Other chronic pain: Secondary | ICD-10-CM | POA: Insufficient documentation

## 2021-07-26 DIAGNOSIS — R1013 Epigastric pain: Secondary | ICD-10-CM | POA: Insufficient documentation

## 2021-08-01 LAB — LIPASE: Lipase: 21 U/L (ref 13–78)

## 2021-08-01 LAB — CBC/DIFF AMBIGUOUS DEFAULT
Basophils Absolute: 0.1 10*3/uL (ref 0.0–0.2)
Basos: 1 %
EOS (ABSOLUTE): 0.1 10*3/uL (ref 0.0–0.4)
Eos: 2 %
Hematocrit: 46.3 % (ref 37.5–51.0)
Hemoglobin: 15.3 g/dL (ref 13.0–17.7)
Immature Grans (Abs): 0 10*3/uL (ref 0.0–0.1)
Immature Granulocytes: 0 %
Lymphocytes Absolute: 2.3 10*3/uL (ref 0.7–3.1)
Lymphs: 38 %
MCH: 26.2 pg — ABNORMAL LOW (ref 26.6–33.0)
MCHC: 33 g/dL (ref 31.5–35.7)
MCV: 79 fL (ref 79–97)
Monocytes Absolute: 0.5 10*3/uL (ref 0.1–0.9)
Monocytes: 8 %
Neutrophils Absolute: 3 10*3/uL (ref 1.4–7.0)
Neutrophils: 51 %
Platelets: 359 10*3/uL (ref 150–450)
RBC: 5.83 x10E6/uL — ABNORMAL HIGH (ref 4.14–5.80)
RDW: 13.3 % (ref 11.6–15.4)
WBC: 5.9 10*3/uL (ref 3.4–10.8)

## 2021-08-01 LAB — COMPREHENSIVE METABOLIC PANEL
ALT: 40 IU/L (ref 0–44)
AST: 21 IU/L (ref 0–40)
Albumin/Globulin Ratio: 1.7 (ref 1.2–2.2)
Albumin: 4.7 g/dL (ref 4.0–5.0)
Alkaline Phosphatase: 74 IU/L (ref 44–121)
BUN/Creatinine Ratio: 17 (ref 9–20)
BUN: 13 mg/dL (ref 6–24)
Bilirubin Total: 0.4 mg/dL (ref 0.0–1.2)
CO2: 23 mmol/L (ref 20–29)
Calcium: 9.1 mg/dL (ref 8.7–10.2)
Chloride: 102 mmol/L (ref 96–106)
Creatinine, Ser: 0.78 mg/dL (ref 0.76–1.27)
Globulin, Total: 2.7 g/dL (ref 1.5–4.5)
Glucose: 90 mg/dL (ref 70–99)
Potassium: 4.7 mmol/L (ref 3.5–5.2)
Sodium: 138 mmol/L (ref 134–144)
Total Protein: 7.4 g/dL (ref 6.0–8.5)
eGFR: 116 mL/min/{1.73_m2} (ref >59)

## 2021-08-01 LAB — SPECIMEN STATUS REPORT

## 2021-08-05 ENCOUNTER — Telehealth: Payer: Self-pay | Admitting: Internal Medicine

## 2021-08-05 NOTE — Telephone Encounter (Signed)
Pt's wife was calling tor lab results. [phone] ?

## 2021-08-05 NOTE — Telephone Encounter (Signed)
These were addressed on 07/29/21. Tammy called patient with results already. It's on the [place] report. Blood work looked good ?

## 2021-08-05 NOTE — Telephone Encounter (Signed)
Returned call to the pt's wife and LMOVM that we would call her regarding her husbands results from 07/25/2021. ?

## 2021-08-06 NOTE — Telephone Encounter (Signed)
Phoned and advised the pt of his result note, and to continue with his medications. Pt expresses understanding ?

## 2021-08-08 ENCOUNTER — Telehealth: Payer: Self-pay | Admitting: Internal Medicine

## 2021-08-08 NOTE — Telephone Encounter (Signed)
[phone] please call patient, he said he is still having bad abdominal pain  ?

## 2021-08-08 NOTE — Telephone Encounter (Signed)
Returned the pt's call and he advised me that he is still having abdomen pain x 4 weeks. No diarrhea but he has constipation. His last BM was this morning which was very little. His pain is at the top of his abdomen especially when he eats anything sweet. Happens a lot with eating sweets. Pt is taking his medication he states. Please advise ?

## 2021-08-08 NOTE — Telephone Encounter (Signed)
Attempted to call patient and his wife on all numbers listed.  No answer.  Sounds like he may be constipated.  Would recommend MiraLAX 1 capful twice daily and see how he does.  His ultrasound was unremarkable.  We can consider HIDA scan to further evaluate his gallbladder as well.   ?

## 2021-08-09 NOTE — Telephone Encounter (Signed)
FYI: ? ?Phoned and spoke with the pt and advised of recommendations to use Miralax (which pt knew what it was) and advised of how many times to do it daily. HIDA scan declined for now . Pt will call back next week if no better. He was advised to start the Miralax today. Pt expressed understanding. ?

## 2021-08-24 ENCOUNTER — Emergency Department (HOSPITAL_COMMUNITY)
Admission: EM | Admit: 2021-08-24 | Discharge: 2021-08-25 | Disposition: A | Discharge status: Home / Self Care | Payer: Self-pay | Attending: Emergency Medicine | Admitting: Emergency Medicine

## 2021-08-24 ENCOUNTER — Other Ambulatory Visit: Payer: Self-pay

## 2021-08-24 ENCOUNTER — Encounter (HOSPITAL_COMMUNITY): Payer: Self-pay | Admitting: Emergency Medicine

## 2021-08-24 DIAGNOSIS — Z87891 Personal history of nicotine dependence: Secondary | ICD-10-CM | POA: Insufficient documentation

## 2021-08-24 DIAGNOSIS — R1013 Epigastric pain: Secondary | ICD-10-CM | POA: Insufficient documentation

## 2021-08-24 LAB — CBC
HCT: 47.9 % (ref 39.0–52.0)
Hemoglobin: 15.4 g/dL (ref 13.0–17.0)
MCH: 26.4 pg (ref 26.0–34.0)
MCHC: 32.2 g/dL (ref 30.0–36.0)
MCV: 82 fL (ref 80.0–100.0)
Platelets: 341 10*3/uL (ref 150–400)
RBC: 5.84 MIL/uL — ABNORMAL HIGH (ref 4.22–5.81)
RDW: 13.3 % (ref 11.5–15.5)
WBC: 9.7 10*3/uL (ref 4.0–10.5)
nRBC: 0 % (ref 0.0–0.2)

## 2021-08-24 LAB — COMPREHENSIVE METABOLIC PANEL
ALT: 23 U/L (ref 0–44)
AST: 17 U/L (ref 15–41)
Albumin: 4.4 g/dL (ref 3.5–5.0)
Alkaline Phosphatase: 59 U/L (ref 38–126)
Anion gap: 11 (ref 5–15)
BUN: 12 mg/dL (ref 6–20)
CO2: 25 mmol/L (ref 22–32)
Calcium: 9.2 mg/dL (ref 8.9–10.3)
Chloride: 102 mmol/L (ref 98–111)
Creatinine, Ser: 1.04 mg/dL (ref 0.61–1.24)
GFR, Estimated: >60 mL/min (ref >60)
Glucose, Bld: 100 mg/dL — ABNORMAL HIGH (ref 70–99)
Potassium: 3.6 mmol/L (ref 3.5–5.1)
Sodium: 138 mmol/L (ref 135–145)
Total Bilirubin: 0.7 mg/dL (ref 0.3–1.2)
Total Protein: 7.7 g/dL (ref 6.5–8.1)

## 2021-08-24 LAB — URINALYSIS, ROUTINE W REFLEX MICROSCOPIC
Bacteria, UA: NONE SEEN
Bilirubin Urine: NEGATIVE
Glucose, UA: NEGATIVE mg/dL
Ketones, ur: NEGATIVE mg/dL
Leukocytes,Ua: NEGATIVE
Nitrite: NEGATIVE
Protein, ur: NEGATIVE mg/dL
Specific Gravity, Urine: 1.017 (ref 1.005–1.030)
pH: 5 (ref 5.0–8.0)

## 2021-08-24 LAB — LIPASE, BLOOD: Lipase: 32 U/L (ref 11–51)

## 2021-08-24 NOTE — ED Triage Notes (Addendum)
Presents for epigastric  and R flank pain for the past 20 days intermittently.  Also endorses dry mouth, intermittent abd distention (present today). Has a GI MD that he has seen for this condition (has had blood work, rx, imaging) and states that he still does not feel better and is now at the ER to get relief.  ? ?Pain worse when pressing on epigastric region. ? ?Denies urinary sx, no radiation, N/V/D (though nausea has occurred before in past), dizziness, SOB, dark or blood stool or emesis  ? ?H/o abd surgery ( bleeding ulcer repair) ? ? ?

## 2021-08-25 MED ORDER — OXYCODONE HCL 5 MG PO TABS: 5.0000 mg | ORAL_TABLET | ORAL | 0 refills | Status: DC | PRN

## 2021-08-25 NOTE — Discharge Instructions (Signed)
You were evaluated in the Emergency Department and after careful evaluation, we did not find any emergent condition requiring admission or further testing in the hospital. ? ?Your exam/testing today was overall reassuring.  Symptoms likely due to acid reflux or may be a stomach ulcer.  Important you continue to take your Protonix, Carafate.  Important to avoid certain foods.  Follow-up closely with your gastroenterologist.  Recommend Tylenol for pain as needed.  Can use the oxycodone for more significant pain. ? ?Please return to the Emergency Department if you experience any worsening of your condition.  Thank you for allowing [place] to be a part of your care. ? ?

## 2021-08-25 NOTE — ED Provider Notes (Signed)
?[facility] DEPT ?[facility] Emergency Department ?Provider Note ?MRN:  [id number]  ?Arrival date & time: 08/25/21    ? ?Chief Complaint   ?Abdominal Pain ?  ?History of Present Illness   ?Keith Hale is a 41 y.o. year-old male with a history of GI bleed presenting to the ED with chief complaint of abdominal pain. ? ?Epigastric abdominal pain for the past 3 weeks.  Worse with meals.  No lower abdominal pain, no fever, no nausea vomiting, no diarrhea or constipation, no blood in stool, no black stool ? ?Review of Systems  ?A thorough review of systems was obtained and all systems are negative except as noted in the HPI and PMH.  ? ?Patient's Health History   ? ?Past Medical History:  ?Diagnosis Date  ? GI hemorrhage 11/01/2018  ?  ?Past Surgical History:  ?Procedure Laterality Date  ? APPENDECTOMY    ? BIOPSY  11/01/2018  ? Procedure: BIOPSY;  Surgeon: Danie Binder, MD;  [facility]: [facility];  Service: Endoscopy;;  ? BIOPSY  12/07/2020  ? Procedure: BIOPSY;  Surgeon: Eloise Harman, DO;  [facility];  Service: Endoscopy;;  ? ESOPHAGOGASTRODUODENOSCOPY (EGD) WITH PROPOFOL N/A 11/01/2018  ? Procedure: ESOPHAGOGASTRODUODENOSCOPY (EGD) WITH PROPOFOL;  Surgeon: Danie Binder, MD;  [facility];  Service: Endoscopy;  Laterality: N/A;  ? ESOPHAGOGASTRODUODENOSCOPY (EGD) WITH PROPOFOL N/A 12/07/2020  ? Procedure: ESOPHAGOGASTRODUODENOSCOPY (EGD) WITH PROPOFOL;  Surgeon: Eloise Harman, DO;  [facility];  Service: Endoscopy;  Laterality: N/A;  9:00am  ? TONSILLECTOMY    ?  ?Family History  ?Problem Relation Age of Onset  ? Hypertension Mother   ? Colon cancer Neg Hx   ?  ?Social History  ? ?Socioeconomic History  ? Marital status: Married  ?  Spouse name: Not on file  ? Number of children: Not on file  ? Years of education: Not on file  ? Highest education level: Not on file  ?Occupational History  ? Not on file  ?Tobacco Use  ? Smoking status: Former  ?  Packs/day:  0.00  ?  Years: 5.00  ?  Pack years: 0.00  ?  Types: Cigarettes  ?  Quit date: 04/21/2017  ?  Years since quitting: 4.3  ? Smokeless tobacco: Never  ? Tobacco comments:  ?  previously smoked about 2 cig/ month.  ?Vaping Use  ? Vaping Use: Never used  ?Substance and Sexual Activity  ? Alcohol use: Not Currently  ? Drug use: No  ? Sexual activity: Not on file  ?Other Topics Concern  ? Not on file  ?Social History Narrative  ? Not on file  ? ?Social Determinants of Health  ? ?Financial Resource Strain: Not on file  ?Food Insecurity: Not on file  ?Transportation Needs: Not on file  ?Physical Activity: Not on file  ?Stress: Not on file  ?Social Connections: Not on file  ?Intimate Partner Violence: Not on file  ?  ? ?Physical Exam  ? ?Vitals:  ? 08/25/21 0145 08/25/21 0200  ?BP: 137/86 113/71  ?Pulse: (!) 51 (!) 51  ?Resp:  18  ?Temp:    ?SpO2: 100% 98%  ?  ?CONSTITUTIONAL: Well-appearing, NAD ?NEURO/PSYCH:  Alert and oriented x 3, no focal deficits ?EYES:  eyes equal and reactive ?ENT/NECK:  no LAD, no JVD ?CARDIO: Regular rate, well-perfused, normal S1 and S2 ?PULM:  CTAB no wheezing or rhonchi ?GI/GU:  non-distended, non-tender ?MSK/SPINE:  No gross deformities, no edema ?SKIN:  no rash, atraumatic ? ? ?*Additional and/or pertinent findings included in MDM below ? ?Diagnostic and Interventional Summary  ? ? EKG Interpretation ? ?Date/Time:    ?Ventricular Rate:    ?PR Interval:    ?QRS Duration:   ?QT Interval:    ?QTC Calculation:   ?R Axis:     ?Text Interpretation:   ?  ? ?  ? ?Labs Reviewed  ?COMPREHENSIVE METABOLIC PANEL - Abnormal; Notable for the following components:  ?    Result Value  ? Glucose, Bld 100 (*)   ? All other components within normal limits  ?CBC - Abnormal; Notable for the following components:  ? RBC 5.84 (*)   ? All other components within normal limits  ?URINALYSIS, ROUTINE W REFLEX MICROSCOPIC - Abnormal; Notable for the following components:  ? Hgb urine dipstick SMALL (*)   ? All other  components within normal limits  ?LIPASE, BLOOD  ?  ?No orders to display  ?  ?Medications - No data to display  ? ?Procedures  /  Critical Care ?Procedures ? ?ED Course and Medical Decision Making  ?Initial Impression and Ddx ?Suspect GERD or possibly a gastric ulcer especially given patient's history.  Abdomen is soft and nontender, vital signs normal, doubt significant intra-abdominal pathology at this time.  No indication for imaging.  Patient is maxed out on Protonix dosing, Carafate.  Needs GI follow-up, will provide short course oxycodone for pain.  Strict return precautions ? ?Past medical/surgical history that increases complexity of ED encounter: GI bleed ? ?Interpretation of Diagnostics ?I personally reviewed the laboratory assessment and my interpretation is as follows: No significant blood count or electrolyte disturbance, normal renal and liver function. ?   ? ? ?Patient Reassessment and Ultimate Disposition/Management ?Discharge home ? ?Patient management required discussion with the following services or consulting groups:  None ? ?Complexity of Problems Addressed ?Acute illness or injury that poses threat of life of bodily function ? ?Additional Data Reviewed and Analyzed ?Further history obtained from: ?Recent discharge summary ? ?Additional Factors Impacting ED Encounter Risk ?Prescriptions ? ?Barth Kirks. Sedonia Small, MD ?[facility] Emergency Medicine ?[place] ?[email or web address] ? ?Final Clinical Impressions(s) / ED Diagnoses  ? ?  ICD-10-CM   ?1. Epigastric pain  R10.13   ?  ?  ?ED Discharge Orders   ? ?      Ordered  ?  oxyCODONE (ROXICODONE) 5 MG immediate release tablet  Every 4 hours PRN       ? 08/25/21 0220  ? ?  ?  ? ?  ?  ? ?Discharge Instructions Discussed with and Provided to Patient:  ? ? ?Discharge Instructions   ? ?  ?You were evaluated in the Emergency Department and after careful evaluation, we did not find any emergent condition requiring admission or further  testing in the hospital. ? ?Your exam/testing today was overall reassuring.  Symptoms likely due to acid reflux or may be a stomach ulcer.  Important you continue to take your Protonix, Carafate.  Important to avoid certain foods.  Follow-up closely with your gastroenterologist.  Recommend Tylenol for pain as needed.  Can use the oxycodone for more significant pain. ? ?Please return to the Emergency Department if you experience any worsening of your condition.  Thank you for allowing [place] to be a part of your care. ? ? ? ? ?  ?Maudie Flakes, MD ?08/25/21 [phone] ? ?

## 2021-08-27 NOTE — Progress Notes (Signed)
? ? ?Referring Provider: No ref. provider found ?Primary Care Physician:  Pcp, No ?Primary GI Physician: Dr. Marletta Lor ? ?Chief Complaint  ?Patient presents with  ? Abdominal Pain  ?  Bloating, gassy, constipation. Nauseated pain in the middle of stomach.    ? ? ?HPI:   ?Keith Hale is a 41 y.o. male presenting today for follow-up of upper abdominal pain.  ? ?He has history of upper GI bleed in the setting of ibuprofen and ASA powder use with findings of duodenal ulcer with visible vessel and H. pylori gastritis on EGD in July 2020.  He was treated with amoxicillin, clarithromycin, pantoprazole.  Follow-up stool test negative in November 2020.  Had worsening epigastric discomfort in August 2022. EGD completed and revealed gastritis biopsied, normal examined duodenum.  Pathology with slight chronic inflammation, negative for H. pylori. Protonix was increased to twice daily.  ? ?Last seen in our office 07/25/2021 by Dr. Marletta Lor.  Reported he had been doing well until approximately 1 week ago when he had sudden onset epigastric pain radiating to right upper quadrant and left upper quadrant with associated nausea especially after meals.  Prior CT in June 2022 largely unremarkable.  Denied melena or hematochezia.  Denied NSAIDs.  Plan to update labs and obtain RUQ ultrasound.  Protonix twice daily was continued and Carafate up to 4 times daily was added.  Recommended 35-month follow-up. ? ?Laboratory evaluation was unrevealing. ?RUQ ultrasound without gallstones, CBD normal, liver normal. ? ?Telephone call 08/08/21 with patient reporting ongoing abdominal pain.  Also reported constipation.  Recommended MiraLAX twice daily.  Discussed the possibility of HIDA, but patient declined for now. ? ?He was seen in the emergency room on 08/24/2021 with epigastric pain.  CBC, CMP, lipase, UA unrevealing.  He was given a short course of oxycodone for pain and advised to follow-up with GI. ? ?Today: ?Reports his upper abdominal pain has  improved some, he had still been experiencing some mild symptoms daily up until the last 4 days.  States he never knows when it is going to come back.  Pain is in the epigastric region and he describes it more as a bloating and when pressing in this area he has tenderness during that time.  Also with early satiety.  Symptoms were triggered by eating and when it occurred, it would last all day.  Had been having some right and left-sided pain, but states this is more along his ribs, not in his abdomen.  No associated nausea, vomiting, or GERD symptoms.  He is taking Protonix 40 mg once daily and has been for about 1 month.  Reduced Protonix to once daily when his symptoms were not as severe.  Has not noticed any worsening with decreasing to once daily.  Denies NSAIDs.  He is still taking Carafate 3 times daily.  He has stopped drinking milk, soda, and limiting spicy foods which has helped some.  Fatty foods and coffee will trigger symptoms. ? ?Down about 4 pounds over the last month.  ? ?Bowels are moving about once a day, but stools are hard and small.  He did take MiraLAX daily for several days until he completed one container.  States that he was taking MiraLAX, his bowels are moving well, but he had no improvement in his abdominal pain.  Denies BRBPR or melena. ? ?Past Medical History:  ?Diagnosis Date  ? GI hemorrhage 11/01/2018  ? duodenal ulcer with visible vessel  ? Helicobacter pylori gastritis 10/2018  ? treated  with amoxicillin, clarithromycin, pantoprazole.  Follow-up stool test negative in November 2020.  ? ? ?Past Surgical History:  ?Procedure Laterality Date  ? APPENDECTOMY    ? BIOPSY  11/01/2018  ? Procedure: BIOPSY;  Surgeon: West BaliFields, Sandi L, MD;  [facility];  Service: Endoscopy;;  ? BIOPSY  12/07/2020  ? Procedure: BIOPSY;  Surgeon: Lanelle Balarver, Charles K, DO;  [facility];  Service: Endoscopy;;  ? ESOPHAGOGASTRODUODENOSCOPY (EGD) WITH PROPOFOL N/A 11/01/2018  ? Surgeon: West BaliFields,  Sandi L, MD;  duodenal ulcer with visible vessel and H. pylori gastritis  ? ESOPHAGOGASTRODUODENOSCOPY (EGD) WITH PROPOFOL N/A 12/07/2020  ? Surgeon: Lanelle Balarver, Charles K, DO; gastritis with biopsies revealing slight chronic inflammation and negative for H. pylori, otherwise normal exam.  ? TONSILLECTOMY    ? ? ?Current Outpatient Medications  ?Medication Sig Dispense Refill  ? Omega-3 1000 MG CAPS Take 1,000 mg by mouth daily.    ? pantoprazole (PROTONIX) 40 MG tablet Take 1 tablet (40 mg total) by mouth 2 (two) times daily before a meal. 60 tablet 5  ? sucralfate (CARAFATE) 1 g tablet Take 1 tablet (1 g total) by mouth 4 (four) times daily -  with meals and at bedtime. 120 tablet 2  ? ?No current facility-administered medications for this visit.  ? ? ?Allergies as of 08/29/2021  ? (No Known Allergies)  ? ? ?Family History  ?Problem Relation Age of Onset  ? Hypertension Mother   ? Colon cancer Neg Hx   ? ? ?Social History  ? ?Socioeconomic History  ? Marital status: Married  ?  Spouse name: Not on file  ? Number of children: Not on file  ? Years of education: Not on file  ? Highest education level: Not on file  ?Occupational History  ? Not on file  ?Tobacco Use  ? Smoking status: Former  ?  Packs/day: 0.00  ?  Years: 5.00  ?  Pack years: 0.00  ?  Types: Cigarettes  ?  Quit date: 04/21/2017  ?  Years since quitting: 4.3  ? Smokeless tobacco: Never  ? Tobacco comments:  ?  previously smoked about 2 cig/ month.  ?Vaping Use  ? Vaping Use: Never used  ?Substance and Sexual Activity  ? Alcohol use: Not Currently  ? Drug use: No  ? Sexual activity: Not on file  ?Other Topics Concern  ? Not on file  ?Social History Narrative  ? Not on file  ? ?Social Determinants of Health  ? ?Financial Resource Strain: Not on file  ?Food Insecurity: Not on file  ?Transportation Needs: Not on file  ?Physical Activity: Not on file  ?Stress: Not on file  ?Social Connections: Not on file  ? ? ?Review of Systems: ?Gen: Denies fever, chills, cold  or flu like symptoms, pre-syncope, or syncope.   ?CV: Denies chest pain or palpitations. ?Resp: Denies dyspnea or cough.  ?GI: See HPI ?Heme: See HPI ? ?Physical Exam: ?BP 127/78   Pulse (!) 56   Temp (!) 97.4 ?F (36.3 ?C) (Temporal)   Wt 194 lb 9.6 oz (88.3 kg)   BMI 28.74 kg/m?  ?General:   Alert and oriented. No distress noted. Pleasant and cooperative.  ?Head:  Normocephalic and atraumatic. ?Eyes:  Conjuctiva clear without scleral icterus. ?Heart:  S1, S2 present without murmurs appreciated. ?Lungs:  Clear to auscultation bilaterally. No wheezes, rales, or rhonchi. No distress.  ?Abdomen:  +BS, soft, non-tender and non-distended. No rebound or guarding. No HSM or masses noted. ?  Msk:  Symmetrical without gross deformities. Normal posture. ?Extremities:  Without edema. ?Neurologic:  Alert and  oriented x4 ?Psych:  Normal mood and affect. ? ? ? ?Assessment:  ?41 year old male with history of upper GI bleed in the setting of ibuprofen and aspirin powders with findings of duodenal ulcer with visible vessel and H. pylori gastritis on EGD in July 2020 s/p treatment with Prevpac and follow-up stool test negative in November 2020 for H. pylori.  EGD in August 2022 due to worsening upper abdominal pain again with gastritis, but biopsies negative for H. pylori, otherwise normal exam.  He presents today for further evaluation of ongoing upper abdominal pain as well as constipation. ? ?Epigastric pain: ?Chronic intermittent epigastric pain described as bloating, triggered by spicy and fatty foods as well as coffee.  Denies associated nausea, vomiting, or reflux symptoms on Protonix 40 mg once daily.  Previously on Protonix twice daily, reduced to once daily when symptoms were not as severe.  Also taking Carafate 3 times daily.  Reports he has been feeling well over the last 4 days, but never knows when his pain will come back though he has been very careful with his diet.  Notes early satiety and also has had documented  4 pound weight loss over the last 4 months.  No history of diabetes.  Last EGD in August 2022 for the same symptoms without any significant abnormalities aside from his gastritis.  RUQ ultrasound in April 202

## 2021-08-28 ENCOUNTER — Ambulatory Visit: Payer: Self-pay | Admitting: Internal Medicine

## 2021-08-29 ENCOUNTER — Encounter: Payer: Self-pay | Admitting: Gastroenterology

## 2021-08-29 ENCOUNTER — Ambulatory Visit (INDEPENDENT_AMBULATORY_CARE_PROVIDER_SITE_OTHER): Payer: Self-pay | Admitting: Gastroenterology

## 2021-08-29 VITALS — BP 127/78 | HR 56 | Temp 97.4°F | Wt 194.6 lb

## 2021-08-29 DIAGNOSIS — K5900 Constipation, unspecified: Secondary | ICD-10-CM

## 2021-08-29 DIAGNOSIS — R1013 Epigastric pain: Secondary | ICD-10-CM | POA: Insufficient documentation

## 2021-08-29 DIAGNOSIS — R6881 Early satiety: Secondary | ICD-10-CM

## 2021-08-29 NOTE — Patient Instructions (Addendum)
Spanish instructions: ?Haremos los arreglos para que se haga una exploraci?n HIDA para evaluar la funcionalidad de su ves?cula biliar en [facility]. Preste atenci?n durante este examen para ver si esto reproduce los s?ntomas que ha estado experimentando. ? ?Contin?e tomando pantoprazol 40 mg una vez al d?a durante 30 minutos para el desayuno. ? ?Puedes detener a Carafate. ? ?Contin?e evitando los alimentos fritos, grasosos, grasosos y picantes. Evite los refrescos. Limite la cafe?na/el caf?. ? ?Para el estre?imiento, comience con MiraLAX 1 tap?n (17 g) al d?a en 8 onzas de agua. ? ?Tendremos m?s recomendaciones para usted despu?s de su escaneo HIDA. ? ?Ermalinda Memos, PA-C ?[facility] ? ? ?English instructions: ?We will arrange you to have a HIDA scan to evaluate the functionality of your gallbladder at [facility].  Pay attention during this exam to see if this reproduces the symptoms you have been experiencing. ? ?Continue taking pantoprazole 40 mg once daily 30 minutes for breakfast. ? ?You may stop Carafate. ? ?Continue to avoid fried, fatty, greasy, spicy foods.  Avoid soda.  Limit caffeine/coffee. ? ?For constipation, start MiraLAX 1 capful (17 g) daily in 8 ounces of water. ? ?We will have further recommendations for you following your HIDA scan. ? ?Ermalinda Memos, PA-C ?[facility] ? ?

## 2021-09-10 ENCOUNTER — Encounter (HOSPITAL_COMMUNITY)
Admission: RE | Admit: 2021-09-10 | Discharge: 2021-09-10 | Disposition: A | Discharge status: Home / Self Care | Payer: Self-pay | Source: Ambulatory Visit | Attending: Gastroenterology | Admitting: Gastroenterology

## 2021-09-10 ENCOUNTER — Encounter (HOSPITAL_COMMUNITY): Payer: Self-pay

## 2021-09-10 DIAGNOSIS — R1013 Epigastric pain: Secondary | ICD-10-CM | POA: Insufficient documentation

## 2021-09-10 MED ORDER — TECHNETIUM TC 99M MEBROFENIN IV KIT
5.0000 | PACK | Freq: Once | INTRAVENOUS | Status: AC | PRN
  Administered 2021-09-10: 5 via INTRAVENOUS

## 2021-09-18 ENCOUNTER — Encounter: Payer: Self-pay | Admitting: *Deleted

## 2021-10-09 ENCOUNTER — Ambulatory Visit: Payer: Self-pay | Admitting: Gastroenterology

## 2021-10-17 NOTE — Progress Notes (Signed)
GI Office Note    Referring Provider: No ref. provider found Primary Care Physician:  Pcp, No Primary GI: Dr. Abbey Chatters  Date:  10/21/2021  ID:  Keith Hale, DOB [birth year], MRN OF:[id number]   Chief Complaint   Chief Complaint  Patient presents with   Diarrhea    States that when he eats he will feel bad for 1 to 2 weeks and won't have normal bm's during that time. At the end of the 1 to 2 week period he will develop diarrhea for 2 to 3 days at which he will feel better until he eats again.      History of Present Illness  Keith Hale is a 41 y.o. male with a history of H Pylori gastritis, GERD, and constipation  presenting today with complaint of diarrhea with abdominal pain.   History of UGI in the setting of ibuprofen and ASA powder use with finding of duodenal ulcer with visible vessel and H. pylori gastritis on EGD in July 2020.  He was treated with clindamycin triple therapy.  Follow-up stool testing in November 2020 was negative.  He had worsening epigastric discomfort in August 2022 with EGD revealing gastritis and biopsy with mild inflammation and negative for H. pylori.  He was advised Protonix twice daily.  Had follow-up in the office 07/25/2021 with Dr. Abbey Chatters where he represented with epigastric pain rating to his right upper quadrant and left upper quadrant with associated nausea after meals.  He was noted that he had prior CT in June 2022 that was unremarkable.  He denied NSAIDs at that time, labs were updated and a right upper quadrant ultrasound was obtained revealing no gallstones or CBD dilation, normal liver.  He was advised to continue PPI twice daily and he was given Carafate 4 times daily.  Patient placed telephone call to the office reporting constipation and abdominal pain.  He was recommended begin MiraLAX twice daily, HIDA scan offered to patient but he declined.  ED visit 08/24/2021 with epigastric pain.  He had CBC, CMP, lipase, and UA that was unrevealing.  He was  given short course oxycodone for his pain.  Last seen in the office 08/29/2021 with Aliene Altes PA.  He was continuing to experience upper abdominal pain but was slightly improved but stated he was unsure when it would occur again.  Primarily described his symptoms as bloating with some tenderness at times.  Also reports early satiety.  Symptoms primarily triggered with eating that would last all day.  Denies nausea, vomiting, reflux symptoms.  He was only taking Protonix 40 mg once a day and taking Carafate 3 times daily.  Reportedly he stopped drinking milk, soda, and was limiting spicy foods which was helping somewhat.  Determined that fatty foods and coffee would also trigger symptoms.  He reported having bowel movement about once a day but stools were hard and small, stating they took MiraLAX daily for several days until used a whole container and with MiraLAX his bowels were moving well but did not have improvement in his abdominal pain.  He was advised to have HIDA scan, continue PPI, advised to stop Carafate, limiting trigger foods, advised to continue MiraLAX daily.  HIDA scan performed 09/10/21 noting reduced gallbladder ejection fraction as can be seen with chronic cholecystitis/biliary dyskinesia.  He was recommended to follow-up with general surgery to discuss cholecystectomy, patient was unable to be reached by phone and letter was mailed.    It does not appear that patient  has seen general surgery.   Today:  When he eats he has alot he has pain in his mid abdomen, occasionally increased with eating. Nausea at least once a day, no vomiting, has noises that come from his stomach. Having BM 1-2 times a day, pain in his belly occurs mostly when may go every other day that is small amount and stool is hard  and he has to strain. Has not tried miralax or anything over the counter. Drinks about 4-5 bottles of water a day. Water has been hurting his stomach as well.  Has had intermittent loose  stools as well and abdominal pain improves after having a BM.   Denies burning in his throat, difficulty swallowing. Does drink coffee.  Patient queried whether or not his symptoms could be related to cancer.  Past Medical History:  Diagnosis Date   GI hemorrhage 11/01/2018   duodenal ulcer with visible vessel   Helicobacter pylori gastritis 10/2018   treated with amoxicillin, clarithromycin, pantoprazole.  Follow-up stool test negative in November 2020.    Past Surgical History:  Procedure Laterality Date   APPENDECTOMY     BIOPSY  11/01/2018   Procedure: BIOPSY;  Surgeon: Danie Binder, MD;  [facility];  Service: Endoscopy;;   BIOPSY  12/07/2020   Procedure: BIOPSY;  Surgeon: Eloise Harman, DO;  [facility];  Service: Endoscopy;;   ESOPHAGOGASTRODUODENOSCOPY (EGD) WITH PROPOFOL N/A 11/01/2018   Surgeon: Danie Binder, MD;  duodenal ulcer with visible vessel and H. pylori gastritis   ESOPHAGOGASTRODUODENOSCOPY (EGD) WITH PROPOFOL N/A 12/07/2020   Surgeon: Eloise Harman, DO; gastritis with biopsies revealing slight chronic inflammation and negative for H. pylori, otherwise normal exam.   TONSILLECTOMY      Current Outpatient Medications  Medication Sig Dispense Refill   pantoprazole (PROTONIX) 40 MG tablet Take 1 tablet (40 mg total) by mouth 2 (two) times daily before a meal. 60 tablet 5   sucralfate (CARAFATE) 1 g tablet Take 1 tablet (1 g total) by mouth 4 (four) times daily -  with meals and at bedtime. 120 tablet 2   No current facility-administered medications for this visit.    Allergies as of 10/21/2021   (No Known Allergies)    Family History  Problem Relation Age of Onset   Hypertension Mother    Colon cancer Neg Hx     Social History   Socioeconomic History   Marital status: Married    Spouse name: Not on file   Number of children: Not on file   Years of education: Not on file   Highest education level: Not on file   Occupational History   Not on file  Tobacco Use   Smoking status: Former    Packs/day: 0.00    Years: 5.00    Total pack years: 0.00    Types: Cigarettes    Quit date: 04/21/2017    Years since quitting: 4.5    Passive exposure: Past   Smokeless tobacco: Never   Tobacco comments:    previously smoked about 2 cig/ month.  Vaping Use   Vaping Use: Never used  Substance and Sexual Activity   Alcohol use: Not Currently   Drug use: No   Sexual activity: Yes  Other Topics Concern   Not on file  Social History Narrative   Not on file   Social Determinants of Health   Financial Resource Strain: Not on file  Food Insecurity: Not on file  Transportation Needs: Not on file  Physical Activity: Not on file  Stress: Not on file  Social Connections: Not on file     Review of Systems   Gen: Denies fever, chills, anorexia. Denies fatigue, weakness, weight loss.  CV: Denies chest pain, palpitations, syncope, peripheral edema, and claudication. Resp: Denies dyspnea at rest, cough, wheezing, coughing up blood, and pleurisy. GI: see HPI Derm: Denies rash, itching, dry skin Psych: Denies depression, anxiety, memory loss, confusion. No homicidal or suicidal ideation.  Heme: Denies bruising, bleeding, and enlarged lymph nodes.   Physical Exam   BP 124/78 (BP Location: Right Arm, Patient Position: Sitting, Cuff Size: Normal)   Pulse 64   Temp (!) 97.1 F (36.2 C) (Temporal)   Ht 5\' 10"  (1.778 m)   Wt 195 lb 9.6 oz (88.7 kg)   SpO2 98%   BMI 28.07 kg/m   General:   Alert and oriented. No distress noted. Pleasant and cooperative.  Head:  Normocephalic and atraumatic. Eyes:  Conjuctiva clear without scleral icterus. Mouth:  Oral mucosa pink and moist. Good dentition. No lesions. Lungs:  Clear to auscultation bilaterally. No wheezes, rales, or rhonchi. No distress.  Heart:  S1, S2 present without murmurs appreciated.  Abdomen:  +BS, soft, non-distended, mild epigastric tenderness.  Negative murphy's. No rebound or guarding. No HSM or masses noted. Rectal: deferred Msk:  Symmetrical without gross deformities. Normal posture. Extremities:  Without edema. Neurologic:  Alert and  oriented x4 Psych:  Alert and cooperative. Normal mood and affect.   Assessment  Keith Hale is a 41 y.o. male with a history of h. Pylori gastritis in 2020 presenting today with biliary dyskinesia, constipation, and gastritis/GERD.    Biliary Dyskinesia/Sphincter of Oddi dysfunction: Recent HIDA scan with gallbladder EF of 16% consistent with biliary dyskinesia. He has pain with eating and drinking, including water. Has been maintained on PPI and carafate with ongoing epigastric discomfort with meals. Having daily nausea without vomiting as well. Weight is stable. Advised patient to consider having gallbladder removed with general surgery. I discussed why this pain occurs with gallbladder dysfunction but patient would like to see if controlling constipation improves his symptoms first. Discussed avoiding fatty/greasy foods and sticking to a low fat/low residue diet to improve his symptoms.   Constipation: Sometimes goes 1-2 times a day and sometimes every other day but usually small amounts and stool is firm, needing to strain to go. Previously advised miralax which he has not tried. Advised him to do this daily either nightly or in the morning and to be consistent with it to get him more regularly. Explained that his intermittent diarrhea and lower abdominal pain that improves after a BM is related to constipation. Constipation handout including dietary choices provided in spanish.   Dyspepsia/Gastritis: History of H. Pylori gastritis treated in 2020. EGD August 2022 revealing gastritis and biopsy with mild inflammation and negative for H. pylori. Currently on pantoprazole 40 mg daily and carafate 1g QID. He does complain of epigastric pain but no complaint of breakthrough reflux symptoms. Advised to  reduce carafate to 1g BID. Abdominal pain mostly likely related to biliary dyskinesia and constipation as discussed above. Continue pantoprazole 40 mg BID. GERD handout provided in spanish.   PLAN    Miralax 1 capful daily or nightly, patient preference.  Continue pantoprazole 40 mg BID.  Reduce carafate to 1g BID.  Lost fat/residue diet - avoid fatty/spicy/greasy foods GERD handout provided. Constipation handout provided. Consider referral to general surgery for  cholecystectomy, will place if patient changes his mind.  Follow up in 8-10 weeks.     [name], MSN, FNP-BC, AGACNP-BC [facility]

## 2021-10-21 ENCOUNTER — Ambulatory Visit (INDEPENDENT_AMBULATORY_CARE_PROVIDER_SITE_OTHER): Payer: Self-pay | Admitting: Gastroenterology

## 2021-10-21 ENCOUNTER — Encounter: Payer: Self-pay | Admitting: Gastroenterology

## 2021-10-21 VITALS — BP 124/78 | HR 64 | Temp 97.1°F | Ht 70.0 in | Wt 195.6 lb

## 2021-10-21 DIAGNOSIS — R1013 Epigastric pain: Secondary | ICD-10-CM

## 2021-10-21 DIAGNOSIS — K5900 Constipation, unspecified: Secondary | ICD-10-CM

## 2021-10-21 DIAGNOSIS — K828 Other specified diseases of gallbladder: Secondary | ICD-10-CM

## 2021-10-21 NOTE — Patient Instructions (Addendum)
English instructions: For your upper abdominal pain which you to continue taking pantoprazole 40 mg twice daily. You should continue to avoid fatty/greasy and spicy foods.  I am going to attach a handout on reflux and appropriate diet.  Your HIDA scan that you had performed at the end of May revealed that your gallbladder is not functioning properly which could be contributing to your abdominal pain especially with eating or drinking.  I think you should strongly consider referral to general surgery to have her gallbladder removed.  For constipation when she began taking MiraLAX 1 capful daily either in the morning or at night which ever is easiest for you.  We will have you follow-up in 8 to 10 weeks to see how you are doing.  If you change your mind about the general surgery referral please contact the office.  It was a pleasure to see you today. I want to create trusting relationships with patients. If you receive a survey regarding your visit,  I greatly appreciate you taking time to fill this out on paper or through your MyChart. I value your feedback.  [name], MSN, FNP-BC, AGACNP-BC [facility]   Spanish instructions: Para su dolor abdominal superior que debe continuar tomando pantoprazol 40 mg [employer]. Debe continuar evitando los alimentos grasosos / grasosos y picantes.  Voy a adjuntar un folleto sobre el reflujo y la [place].  Su exploracin HIDA que haba realizado a fines de mayo revel que su vescula biliar no est funcionando correctamente, lo que podra estar contribuyendo a su dolor abdominal, especialmente al comer o beber.  Creo que debera considerar seriamente la derivacin a una ciruga general para extirparle la vescula biliar.  Para el estreimiento cuando comenz a tomar MiraLAX 1 capful diariamente, ya sea por la maana o por la noche, lo que sea ms fcil para usted.  Le haremos un seguimiento en 8 a 10 semanas para  ver cmo est.  Si cambia de opinin sobre la referencia de ciruga general, comunquese con la oficina.  Fue un Civil engineer, contracting. [name] crear relaciones de confianza con los [name]. Si recibe [employer] su visita, le agradezco mucho que se tome el tiempo para completarla en papel o a travs de su MyChart. Valoro sus comentarios.  [name], MSN, FNP-BC, AGACNP-BC [name]

## 2021-11-04 ENCOUNTER — Ambulatory Visit: Payer: Self-pay | Admitting: Gastroenterology

## 2021-12-10 ENCOUNTER — Encounter: Payer: Self-pay | Admitting: Gastroenterology

## 2021-12-10 ENCOUNTER — Ambulatory Visit (INDEPENDENT_AMBULATORY_CARE_PROVIDER_SITE_OTHER): Payer: Self-pay | Admitting: Gastroenterology

## 2021-12-10 VITALS — BP 131/81 | HR 69 | Temp 98.2°F | Ht 67.0 in | Wt 196.8 lb

## 2021-12-10 DIAGNOSIS — R110 Nausea: Secondary | ICD-10-CM

## 2021-12-10 DIAGNOSIS — G8929 Other chronic pain: Secondary | ICD-10-CM

## 2021-12-10 DIAGNOSIS — K219 Gastro-esophageal reflux disease without esophagitis: Secondary | ICD-10-CM

## 2021-12-10 DIAGNOSIS — K5900 Constipation, unspecified: Secondary | ICD-10-CM

## 2021-12-10 DIAGNOSIS — K828 Other specified diseases of gallbladder: Secondary | ICD-10-CM

## 2021-12-10 DIAGNOSIS — R1013 Epigastric pain: Secondary | ICD-10-CM

## 2021-12-10 NOTE — Progress Notes (Signed)
GI Office Note    Referring Provider: No ref. provider found Primary Care Physician:  Pcp, No Primary Gastroenterologist: Dr. Marletta Lor  Date:  12/10/2021  ID:  Keith Hale, DOB [birth year], MRN [id number]   Chief Complaint   Chief Complaint  Patient presents with   Abdominal Pain    Stomach is burning and it hurts. Thinks he wants his gallbladder removed     History of Present Illness  Keith Hale is a 41 y.o. male with a history of H. pylori gastritis, GERD, constipation presenting today for follow-up.  History of upper GI bleed in the setting of ibuprofen and ASA powder use with finding of duodenal ulcer with visible vessel and H. pylori gastritis on EGD in July 2020.  He was treated with clindamycin triple therapy.  He had follow-up stool testing in November 2020 that was negative.  He had worsening epigastric discomfort in August 2022 with EGD revealing gastritis and biopsy with mild inflammation and negative for H. pylori.  Is advised to continue PPI twice daily.  He had follow-up office visit in April 2023 with Dr. Marletta Lor where he represented with epigastric pain radiating to his right upper quadrant and left lower quadrant with associated nausea after meals.  On prior CT in June 2020 it was unremarkable.  He denied NSAID use at that time and he had right upper quadrant ultrasound that was negative.  As well as continue PPI twice daily and was given Carafate 4 times daily.  He had placed telephone call to the office reporting constipation abdominal pain was recommended to begin MiraLAX twice daily he was offered HIDA scan which he initially declined.  ED visit 5/6-23 with epigastric pain.  Unrevealing CBC, CMP, lipase, and UA.  Was given oxycodone for pain.  Office visit with Baxter Hire in May 2023 for a continue to report upper abdominal pain was slight improvement.  Mostly described his symptoms as bloating with tenderness at times.  Reported early satiety symptoms were primarily  triggered with eating and then would last all day.  He denied nausea, vomiting, reflux.  He was only taking Protonix daily and Carafate 3 times daily.  He reportedly stopped drinking milk, sodas, limiting spicy foods and that was somewhat helping his symptoms.  It was determined that fatty foods and coffee would trigger symptoms as well.  Reported once daily bowel movements with MiraLAX but that was not improving his abdominal pain.  HIDA scan was ordered, he was advised to continue PPI and stop Carafate, and limit trigger foods.  HIDA scan May 2023 noting reduced gallbladder ejection fraction of 16%.  He was recommended to follow-up with general surgery to discuss cholecystectomy.  Last seen in the office by me on 10/21/2021.  He reported mid abdominal pain with eating and at least nausea once a day without vomiting.  He reported having 1-2 bowel movements per day that were hard in nature and was having to strain.  He denied any MiraLAX use at that time and was drinking about 4-5 bottles of water a day.  He reported that even water was hurting his stomach.  He reported abdominal pain did improve after having bowel movement.  I asked for him to begin MiraLAX 1 capful daily, continue pantoprazole twice daily and to begin taking Carafate 1 g twice daily instead of 3-4 times daily.  We also discussed dietary restrictions.  He was strongly advised to consider general surgery referral for cholecystectomy.  Today: Still having nausea but not  vomiting. Epigastric pain mostly. Sometimes feels good but usually only lasts about 1 day. There are times where everything makes him feel bad, even water. Patient continues to query if he could have stomach cancer. Taking pantoprazole twice daily. Trying to stay away from fatty and spicy foods. Does drink coffee everyday. Not a lot of chocolate. Does eat a lot of tomato based products.   Constipation is better. Not taking the miralax currently. Bowel movements are currently  soft, going everyday, not having to strain.   Denies burning in the throat or swallowing issues. Not currently having pain.    Current Outpatient Medications  Medication Sig Dispense Refill   pantoprazole (PROTONIX) 40 MG tablet Take 1 tablet (40 mg total) by mouth 2 (two) times daily before a meal. 60 tablet 5   sucralfate (CARAFATE) 1 g tablet Take 1 tablet (1 g total) by mouth 4 (four) times daily -  with meals and at bedtime. (Patient not taking: Reported on 12/10/2021) 120 tablet 2   No current facility-administered medications for this visit.    Past Medical History:  Diagnosis Date   GI hemorrhage 11/01/2018   duodenal ulcer with visible vessel   Helicobacter pylori gastritis 10/2018   treated with amoxicillin, clarithromycin, pantoprazole.  Follow-up stool test negative in November 2020.    Past Surgical History:  Procedure Laterality Date   APPENDECTOMY     BIOPSY  11/01/2018   Procedure: BIOPSY;  Surgeon: West Bali, MD;  [facility]: [facility];  Service: Endoscopy;;   BIOPSY  12/07/2020   Procedure: BIOPSY;  Surgeon: Lanelle Bal, DO;  [facility]: [facility];  Service: Endoscopy;;   ESOPHAGOGASTRODUODENOSCOPY (EGD) WITH PROPOFOL N/A 11/01/2018   Surgeon: West Bali, MD;  duodenal ulcer with visible vessel and H. pylori gastritis   ESOPHAGOGASTRODUODENOSCOPY (EGD) WITH PROPOFOL N/A 12/07/2020   Surgeon: Lanelle Bal, DO; gastritis with biopsies revealing slight chronic inflammation and negative for H. pylori, otherwise normal exam.   TONSILLECTOMY      Family History  Problem Relation Age of Onset   Hypertension Mother    Colon cancer Neg Hx     Allergies as of 12/10/2021   (No Known Allergies)    Social History   Socioeconomic History   Marital status: Married    Spouse name: Not on file   Number of children: Not on file   Years of education: Not on file   Highest education level: Not on file  Occupational History   Not on file   Tobacco Use   Smoking status: Former    Packs/day: 0.00    Years: 5.00    Total pack years: 0.00    Types: Cigarettes    Quit date: 04/21/2017    Years since quitting: 4.6    Passive exposure: Past   Smokeless tobacco: Never   Tobacco comments:    previously smoked about 2 cig/ month.  Vaping Use   Vaping Use: Never used  Substance and Sexual Activity   Alcohol use: Not Currently   Drug use: No   Sexual activity: Yes  Other Topics Concern   Not on file  Social History Narrative   Not on file   Social Determinants of Health   Financial Resource Strain: Not on file  Food Insecurity: Not on file  Transportation Needs: Not on file  Physical Activity: Not on file  Stress: Not on file  Social Connections: Not on file     Review of Systems  Gen: Denies fever, chills, anorexia. Denies fatigue, weakness, weight loss.  CV: Denies chest pain, palpitations, syncope, peripheral edema, and claudication. Resp: Denies dyspnea at rest, cough, wheezing, coughing up blood, and pleurisy. GI: See HPI Derm: Denies rash, itching, dry skin Psych: Denies depression, anxiety, memory loss, confusion. No homicidal or suicidal ideation.  Heme: Denies bruising, bleeding, and enlarged lymph nodes.   Physical Exam   BP 131/81 (BP Location: Right Arm, Patient Position: Sitting, Cuff Size: Normal)   Pulse 69   Temp 98.2 F (36.8 C) (Temporal)   Ht 5\' 7"  (1.702 m)   Wt 196 lb 12.8 oz (89.3 kg)   BMI 30.82 kg/m   General:   Alert and oriented. No distress noted. Pleasant and cooperative.  Head:  Normocephalic and atraumatic. Eyes:  Conjuctiva clear without scleral icterus. Lungs:  Clear to auscultation bilaterally. No wheezes, rales, or rhonchi. No distress.  Heart:  S1, S2 present without murmurs appreciated.  Abdomen:  +BS, soft, non-distended.  Tenderness to epigastrium. no rebound or guarding. No HSM or masses noted. Rectal: Deferred Msk:  Symmetrical without gross deformities. Normal  posture. Extremities:  Without edema. Neurologic:  Alert and  oriented x4 Psych:  Alert and cooperative. Normal mood and affect.   Assessment  Keith Hale is a 41 y.o. male with a history of H. pylori gastritis, GERD, constipation presenting today for follow-up.  Biliary dyskinesia/sphincter of Oddi dysfunction: HIDA scan May 2023 with evidence of biliary dyskinesia, EF 16%.  He continues to have some daily nausea without vomiting.  He continues to also have epigastric pain as well as right upper quadrant pain.  He reports that at times he feels good but may only have a day here and there.  Sometimes anything that he eats or drinks including water is bothersome to him.  He is more amenable now to the idea of having his gallbladder removed.  We will refer him to general surgery today.  I suspect that a large amount of his symptoms are likely due to his gallbladder.  We will see what recommendations general surgery makes.  Constipation: Improved.  Having daily soft bowel movements.  For the last few days he reported some looser stools but only once per day.  Could be likely due to something that he has eaten.  We will continue to monitor for any overt diarrhea which would not be uncommon given his biliary dyskinesia.  GERD/gastritis: History of H. pylori s/p treatment and documented eradication.  Recent EGD in 2022 with negative biopsies, only gastritis.  Ongoing epigastric discomfort.  Has not been adhering to a GERD diet.  Frequently has tomato based foods as well as caffeine in the form of coffee.  Has been taking his pantoprazole 40 mg twice daily and Carafate 3-4 times a day.  We discussed weaning off Carafate and only taking pantoprazole.  Discussed possible further management once he is considered having his gallbladder removed to see if symptoms continue.  Interpreter present for the entirety of the exam.   PLAN   Continue pantoprazole 40 mg BID.  Wean off Carafate General surgery  referral for cholecystectomy.  GERD diet reinforced, education provided. Follow-up in 4 months    2023, MSN, FNP-BC, AGACNP-BC [facility]

## 2021-12-10 NOTE — Patient Instructions (Signed)
English: We are sending a referral to general surgery for you to discuss having your gallbladder removed.  For now which you to continue the pantoprazole 40 mg twice daily.  If you feel like the Carafate is helping you, you may continue it 2-3 times a day for now.  We could work on weaning that off or stopping it altogether.  If you follow a GERD diet and avoid spicy, fatty/greasy foods and acidic foods this may improve the upper abdominal discomfort that you have as well.  Follow a GERD diet:  Avoid fried, fatty, greasy, spicy, citrus foods. (Tomatoes, oranges, pineapples are examples) Avoid caffeine and carbonated beverages. Avoid chocolate. Try eating 4-6 small meals a day rather than 3 large meals. Do not eat within 3 hours of laying down. Prop head of bed up on wood or bricks to create a 6 inch incline.  Please not hesitate to reach out in the meantime if you have any further issues.  It was a pleasure to see you today. I want to create trusting relationships with patients. If you receive a survey regarding your visit,  I greatly appreciate you taking time to fill this out on paper or through your MyChart. I value your feedback.  [name], MSN, FNP-BC, AGACNP-BC [facility]  Spanish: Conley Rolls enviaremos Neomia Dear derivacin a [employer] general para que hable sobre la extirpacin de la vescula biliar.  Por ahora, debe continuar con pantoprazol 40 mg [employer]. Si siente que el Carafate le est ayudando, puede continuarlo 2 o 3 veces al da por ahora. Podramos trabajar para eliminarlo o detenerlo por completo. Si sigue una dieta para la ERGE y [employer] alimentos picantes, grasosos y cidos, esto tambin puede mejorar el malestar abdominal superior que tiene.  Siga una dieta para la ERGE: Evite los alimentos fritos, grasos, grasosos, picantes y ctricos. (Tomates, naranjas, pias son ejemplos) Evite la cafena y las bebidas carbonatadas. Evite el  chocolate. Intente comer de 4 a 6 comidas pequeas al Geophysical data processor de 3 comidas grandes. No coma dentro de las 3 horas posteriores a Teacher, music. Apoye la cabecera de la cama sobre [place] o ladrillos para crear una inclinacin de 6 pulgadas.  Mientras tanto, no dude en comunicarse con nosotros si tiene ms problemas.  Fue un Arboriculturist. [name] crear relaciones de confianza con los [place]. Si recibe [employer] su visita, le agradezco mucho que se tome el tiempo para completarla en papel o a travs de MyChart. Valoro tus comentarios.  [name], MSN, FNP-BC, AGACNP-BC [facility] [place]

## 2021-12-19 ENCOUNTER — Encounter: Payer: Self-pay | Admitting: *Deleted

## 2022-01-07 ENCOUNTER — Inpatient Hospital Stay: Payer: Self-pay | Admitting: Gastroenterology

## 2022-01-14 ENCOUNTER — Ambulatory Visit (INDEPENDENT_AMBULATORY_CARE_PROVIDER_SITE_OTHER): Payer: Self-pay | Admitting: General Surgery

## 2022-01-14 ENCOUNTER — Encounter: Payer: Self-pay | Admitting: General Surgery

## 2022-01-14 VITALS — BP 119/79 | HR 53 | Temp 97.9°F | Resp 12 | Ht 67.0 in | Wt 196.0 lb

## 2022-01-14 DIAGNOSIS — K828 Other specified diseases of gallbladder: Secondary | ICD-10-CM | POA: Insufficient documentation

## 2022-01-14 NOTE — Progress Notes (Signed)
[facility] History and Physical  Reason for Referral:*** Referring Physician: ***  Chief Complaint   New Patient (Initial Visit)     Keith Hale is a 41 y.o. male.  HPI: ***.  The *** started *** and has had a duration of ***.  It is associated with ***.  The *** is improved with ***, and is made worse with ***.    Quality*** Context***  Past Medical History:  Diagnosis Date  . GI hemorrhage 11/01/2018   duodenal ulcer with visible vessel  . Helicobacter pylori gastritis 10/2018   treated with amoxicillin, clarithromycin, pantoprazole.  Follow-up stool test negative in November 2020.    Past Surgical History:  Procedure Laterality Date  . APPENDECTOMY    . BIOPSY  11/01/2018   Procedure: BIOPSY;  Surgeon: Danie Binder, MD;  [facility];  Service: Endoscopy;;  . BIOPSY  12/07/2020   Procedure: BIOPSY;  Surgeon: Eloise Harman, DO;  [facility]: [facility];  Service: Endoscopy;;  . ESOPHAGOGASTRODUODENOSCOPY (EGD) WITH PROPOFOL N/A 11/01/2018   Surgeon: Danie Binder, MD;  duodenal ulcer with visible vessel and H. pylori gastritis  . ESOPHAGOGASTRODUODENOSCOPY (EGD) WITH PROPOFOL N/A 12/07/2020   Surgeon: Eloise Harman, DO; gastritis with biopsies revealing slight chronic inflammation and negative for H. pylori, otherwise normal exam.  . TONSILLECTOMY      Family History  Problem Relation Age of Onset  . Hypertension Mother   . Colon cancer Neg Hx     Social History   Tobacco Use  . Smoking status: Former    Packs/day: 0.00    Years: 5.00    Total pack years: 0.00    Types: Cigarettes    Quit date: 04/21/2017    Years since quitting: 4.7    Passive exposure: Past  . Smokeless tobacco: Never  . Tobacco comments:    previously smoked about 2 cig/ month.  Vaping Use  . Vaping Use: Never used  Substance Use Topics  . Alcohol use: Not Currently  . Drug use: No    Medications: {medication  reviewed/display:3041432} Allergies as of 01/14/2022   No Known Allergies      Medication List        Accurate as of January 14, 2022 10:55 AM. If you have any questions, ask your nurse or doctor.          pantoprazole 40 MG tablet Commonly known as: PROTONIX Take 1 tablet (40 mg total) by mouth 2 (two) times daily before a meal.   sucralfate 1 g tablet Commonly known as: CARAFATE Take 1 tablet (1 g total) by mouth 4 (four) times daily -  with meals and at bedtime.         ROS:  {Review of Systems:30496}  Blood pressure 119/79, pulse (!) 53, temperature 97.9 F (36.6 C), temperature source Oral, resp. rate 12, height 5\' 7"  (1.702 m), weight 196 lb (88.9 kg), SpO2 99 %. Physical Exam  Results: CLINICAL DATA:  Postprandial epigastric abdominal pain.   EXAM: NUCLEAR MEDICINE HEPATOBILIARY IMAGING WITH GALLBLADDER EF   TECHNIQUE: Sequential images of the abdomen were obtained out to 60 minutes following intravenous administration of radiopharmaceutical. After oral ingestion of Ensure, gallbladder ejection fraction was determined. At 60 min, normal ejection fraction is greater than 33%.   RADIOPHARMACEUTICALS:  5.0 mCi Tc-77m  Choletec IV   COMPARISON:  Right upper quadrant ultrasound July 26, 2021   FINDINGS: Prompt uptake and biliary excretion of activity by the  liver is seen. Gallbladder activity is visualized, consistent with patency of cystic duct. Biliary activity passes into small bowel, consistent with patent common bile duct.   Calculated gallbladder ejection fraction is 16%. (Normal gallbladder ejection fraction with Ensure is greater than 33%.)   IMPRESSION: Reduced gallbladder ejection fraction as can be seen with chronic cholecystitis/biliary dyskinesia.     Electronically Signed   By: Dahlia Bailiff M.D.   On: 09/10/2021 14:49   CLINICAL DATA:  Epigastric abdominal pain   EXAM: ULTRASOUND ABDOMEN LIMITED RIGHT UPPER QUADRANT    COMPARISON:  CT abdomen pelvis 10/07/2020   FINDINGS: Gallbladder:   Gallstones: None   Sludge: None   Gallbladder Wall: Within normal limits   Pericholecystic fluid: None   Sonographic Murphy's Sign: Negative per technologist   Common bile duct:   Diameter: 3 mm   Liver:   Parenchymal echogenicity: Within normal limits   Lesions: None   Portal vein: Patent.  Hepatopetal flow   Other: None.   IMPRESSION: No significant sonographic abnormality of the liver or gallbladder.     Electronically Signed   By: Miachel Roux M.D.   On: 07/27/2021 10:48     Assessment & Plan:  Keith Hale is a 41 y.o. male with *** -*** -*** -Follow up ***  All questions were answered to the satisfaction of the patient and family***.  The risk and benefits of *** were discussed including but not limited to ***.  After careful consideration, Keith Hale has decided to ***.    Virl Cagey 01/14/2022, 10:55 AM

## 2022-01-14 NOTE — Patient Instructions (Signed)
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 La vescula biliar es un rgano que tiene forma de pera y se encuentra debajo del hgado, del lado derecho del cuerpo. La vescula biliar almacena bilis, un lquido que ayuda al organismo a digerir las grasas. La colecistectoma se realiza con frecuencia para tratar la inflamacin (irritacin e hinchazn) de la vescula biliar (colecistitis). Por lo general, esta afeccin se debe a una acumulacin de clculos biliares (colelitiasis) en la vescula biliar o al estancamiento del lquido de la vescula biliar a causa de que los clculos biliares se atascan en los conductos (tubos) y obstruyen el paso de la bilis. Esto puede producir inflamacin y Engineer, mining. En los [employer], podr ser [place]. Este procedimiento se realiza a travs de pequeas incisiones en el abdomen, en lugar de una incisin grande. Tambin se denomina "ciruga laparoscpica". Se introduce un endoscopio delgado que tiene Secretary/administrator (laparoscopio) a travs de una incisin. A travs de las otras incisiones, se introducen los instrumentos quirrgicos. En algunos casos, es posible que [place] mnimamente invasiva deba cambiarse a una [place] realizada a travs de una incisin ms grande. Esta se denomina "ciruga abierta". Informe al mdico acerca de lo siguiente: Cualquier alergia que tenga. Todos los [employer] [place], incluidos vitaminas, hierbas, gotas oftlmicas, cremas y [address] de [address]. Problemas previos que usted o algn miembro de su familia hayan tenido con los anestsicos. Cualquier problema de la sangre que tenga. Cirugas a las que se haya sometido. Cualquier afeccin mdica que tenga. Si est embarazada o podra estarlo. Cules son los riesgos? En general, se trata de un procedimiento seguro. Sin embargo,  pueden ocurrir complicaciones, por ejemplo: Infeccin. Sangrado. Reacciones alrgicas a los medicamentos. Daos a las estructuras o los rganos cercanos. Un clculo biliar que queda en el conducto biliar comn. El conducto coldoco transporta la bilis desde la vescula biliar hasta el intestino delgado. Una filtracin de bilis del hgado o del conducto qustico despus de que se extirpa la vescula biliar. Qu ocurre antes del procedimiento? Pregntele al mdico: Cmo se Forensic psychologist de la Leisure centre manager. Qu medidas se tomarn para evitar una infeccin. Pueden incluir: Rasurar el vello del lugar de la ciruga. Lavar la piel con un jabn antisptico. Recibir antibiticos. Qu ocurre durante el procedimiento?  Le colocarn una va intravenosa (i.v.) en una vena. Le administrarn uno de los siguientes medicamentos o ambos: Un medicamento para ayudar a Lexicographer (sedante). Un medicamento que lo har dormir (anestesia general). Su cirujano le har varias incisiones pequeas en el abdomen. El laparoscopio se introducir a travs de una de las pequeas incisiones. La cmara del laparoscopio enviar imgenes a un monitor que se encuentra en el quirfano. Esto permitir a su Pensions consultant del abdomen. Le inyectarn un gas en el abdomen. Esto expandir el abdomen para que el cirujano tenga ms lugar para Facilities manager. El resto del instrumental necesario para el procedimiento se introducir a travs de las otras incisiones. Se extirpar la vescula biliar a travs de una de las incisiones. Se puede examinar el conducto coldoco. Si se encuentran clculos en la va biliar, tal vez deban extirparse. Despus de la extirpacin de la vescula biliar, se cerrarn las incisiones con puntos (suturas), grapas o goma para cerrar la piel. Las incisiones pueden cubrirse con una venda (vendaje). El procedimiento puede variar segn el mdico y el hospital. Ladell Heads ocurre despus  del procedimiento? Le  controlarn la presin arterial, la frecuencia cardaca, la frecuencia respiratoria y Retail buyer de oxgeno en la sangre hasta que le den el alta del hospital o la clnica. Le darn analgsicos para Financial controller, si es necesario. Es posible que le coloquen un drenaje en la incisin. Se lo retirarn [employer] despus del procedimiento. Resumen La colecistectoma mnimamente invasiva, tambin llamada colecistectoma laparoscpica, es una ciruga que se realiza para extirpar la vescula biliar a travs de pequeas incisiones. Informe a su mdico sobre todas las otras afecciones que tenga y [place] todos los medicamentos que est usando para dichas afecciones. Antes del procedimiento, siga las instrucciones sobre cundo dejar de comer y beber y Christene Slates cambiar o suspender medicamentos. Haga que un adulto responsable lo cuide durante el tiempo que le indiquen despus de que le den el alta del hospital o de la clnica. Esta informacin no tiene Marine scientist el consejo del mdico. Asegrese de hacerle al mdico cualquier pregunta que tenga. Document Revised: 10/24/2020 Document Reviewed: 10/24/2020 Elsevier Patient Education  [place] biliar Biliary Dyskinesia  La discinesia biliar es una afeccin en la que la vescula biliar o los conductos biliares no pueden liberar o transportar la bilis con normalidad. La bilis es un lquido que se produce en el hgado para ayudar al cuerpo a digerir los alimentos. La bilis circula hasta la vescula biliar para ser almacenada. Cuando se necesita bilis para la digestin, esta sale de la vescula biliar y [place] a travs de los conductos biliares hasta el tubo digestivo. La discinesia biliar provoca que la bilis se acumule y esto puede causar dolor en el abdomen. Esta afeccin tambin puede llamarse: Colecistitis acalculosa. Trastorno funcional de la vescula biliar. Disfuncin del esfnter de Oddi. Este es un tipo de discinesia  biliar. Cules son las causas? La causa de la discinesia biliar es un mal funcionamiento de la vescula biliar. La mayora de las veces se desconoce la causa exacta. Una de las causas puede ser cambios en la vescula biliar causados por la obesidad. Qu incrementa el riesgo? Es ms probable que una persona tenga esta afeccin si su madre o padre la tuvieron. Tambin puede desarrollarse si la persona: Tiene sobrepeso. Es mujer. Tiene entre 60 y 7 aos. Cules son los signos o sntomas? El sntoma principal de esta afeccin es dolor en el lado superior derecho del abdomen. Por lo general, el dolor: Comienza unos 30 minutos despus de comer, en especial si la comida es condimentada o grasosa. Dura 30 minutos o ms. El dolor se incrementa gradualmente hasta que se estabiliza y tiene la intensidad suficiente para interrumpir las actividades diarias. Otros sntomas pueden incluir: Nuseas o vmitos. Sudoracin. Clicos o distensin en el abdomen. Acidez estomacal o eructos. Diarrea. Cmo se diagnostica? Esta afeccin se puede diagnosticar en funcin de los sntomas, los antecedentes mdicos y un examen fsico. Pueden hacerle pruebas para descartar otras afecciones y Physicist, medical diagnstico. Las pruebas pueden incluir las siguientes: [place]. Ecografas de la vescula biliar. Gammagrafa hepatobiliar con cido iminodiactico (HIDA). Es una radiografa que puede mostrar si la vescula biliar vaca menos cantidad de bilis de lo normal (fraccin de expulsin vesicular). Una resonancia magntica (RM) o exploracin por tomografa computarizada (TC) del abdomen. CPRE (colangiopancreatografa retrgrada endoscpica). Durante este procedimiento, se introduce un tubo delgado con una cmara en su extremo por la garganta [employer] zonas a examinar, como el pncreas, los conductos biliares, el hgado  y la vescula biliar. Se inyecta un tinte en la sangre y luego se toman radiografas. El tinte  ayuda al mdico a observar las zonas a Ecologist. Tambin pueden realizarse una CPRE para ayudar a diagnosticar la disfuncin de esfnter de Oddi. Cmo se trata? El tratamiento depende de la causa. Por lo general, Financial risk analyst paso del tratamiento es realizar cambios en el estilo de vida. El mdico puede recomendarle lo siguiente: Hacer reposo. [place] de [place]. Evitar comidas condimentadas, aceitosas o grasosas. Tomar analgsicos de venta libre o con receta. En algunos casos, la afeccin mejora solo con cambios en el estilo de vida. Sin embargo, en [employer], es necesario realizar una ciruga para extirpar la vescula biliar (colecistectoma). Siga estas instrucciones en su casa: Medicamentos Use los medicamentos de venta libre y los recetados solamente como se lo haya indicado el mdico. Pregntele al mdico si el medicamento recetado: Requiere que evite conducir o usar [place]. Puede causarle estreimiento. Es posible que tenga que tomar estas medidas para prevenir o tratar el estreimiento: Product manager suficiente lquido como para Pharmacologist la orina de color amarillo plido. Usar medicamentos recetados o de Sales promotion account executive. Consumir alimentos ricos en fibra, como frijoles, cereales integrales, y frutas y verduras frescas. Limitar el consumo de alimentos ricos en grasa y azcares procesados, como los alimentos fritos o dulces. Consumo de alcohol El alcohol puede irritar el estmago y [place]. Si bebe alcohol: Limite la cantidad que bebe a lo siguiente: De 0 a 1 medida por da para las mujeres que no estn embarazadas. De 0 a 2 medidas por da para los hombres. Sepa cunta cantidad de alcohol hay en las bebidas. En los [address], una medida equivale a una botella de cerveza de 12 oz (355 ml), un vaso de vino de 5 oz (148 ml) o un vaso de una bebida alcohlica de alta graduacin de 1 oz (44 ml). Instrucciones generales Descanse y retome sus actividades normales como se lo haya indicado el  mdico. Pregntele al mdico qu actividades son seguras para usted. Siga las instrucciones del mdico respecto de las restricciones en las comidas o las bebidas. Es posible que deba limitar el consumo de alimentos grasosos, aceitosos y condimentados si le causan sntomas. No consuma ningn producto que contenga nicotina o tabaco. Estos productos incluyen cigarrillos, tabaco para Theatre manager y aparatos de vapeo, como los Administrator, Civil Service. Si necesita ayuda para dejar de fumar, consulte al [employer]. Fumar puede daar el aparato digestivo. Concurra a todas las visitas de seguimiento. Esto es importante. Comunquese con un mdico si: Vuelve a Surveyor, mining en el abdomen. Sus sntomas se tornan ms graves o continan durante ms de 3 meses. Tiene alguno de los siguientes sntomas: Nuseas o vmitos. Diarrea. Clicos o distensin en el abdomen. Resumen La discinesia biliar es una afeccin en la que la vescula biliar o los conductos biliares no pueden liberar o transportar la bilis con normalidad. El sntoma principal de esta afeccin es dolor en el lado superior derecho del abdomen. El dolor comienza unos 30 minutos despus de comer, en especial si la comida es condimentada o [place]. El tratamiento depende de la causa y, por lo general, implica realizar cambios en el estilo de vida, como esforzarse para perder peso y evitar ciertos alimentos. En muchos casos, es necesario realizar [place] para extirpar la vescula biliar (colecistectoma). Esta informacin no tiene Theme park manager el consejo del mdico. Asegrese de hacerle al mdico cualquier pregunta que tenga. Document Revised: 02/16/2020 Document Reviewed: 02/16/2020 Elsevier  Patient Education  2023 Elsevier Inc.  Minimally Invasive Cholecystectomy Minimally invasive cholecystectomy is surgery to remove the gallbladder. The gallbladder is a pear-shaped organ that lies beneath the liver on the right side of the body. The gallbladder  stores bile, which is a fluid that helps the body digest fats. Cholecystectomy is often done to treat inflammation (irritation and swelling) of the gallbladder (cholecystitis). This condition is usually caused by a buildup of gallstones (cholelithiasis) in the gallbladder or when the fluid in the gall bladder becomes stagnant because gallstones get stuck in the ducts (tubes) and block the flow of bile. This can result in inflammation and pain. In severe cases, emergency surgery may be required. This procedure is done through small incisions in the abdomen, instead of one large incision. It is also called laparoscopic surgery. A thin scope with a camera (laparoscope) is inserted through one incision. Then surgical instruments are inserted through the other incisions. In some cases, a minimally invasive surgery may need to be changed to a surgery that is done through a larger incision. This is called open surgery. Tell a health care provider about: Any allergies you have. All medicines you are taking, including vitamins, herbs, eye drops, creams, and over-the-counter medicines. Any problems you or family members have had with anesthetic medicines. Any bleeding problems you have. Any surgeries you have had. Any medical conditions you have. Whether you are pregnant or may be pregnant. What are the risks? Generally, this is a safe procedure. However, problems may occur, including: Infection. Bleeding. Allergic reactions to medicines. Damage to nearby structures or organs. A gallstone remaining in the common bile duct. The common bile duct carries bile from the gallbladder to the small intestine. A bile leak from the liver or cystic duct after your gallbladder is removed. What happens before the procedure? Medicines Ask your health care provider about: Changing or stopping your regular medicines. This is especially important if you are taking diabetes medicines or blood thinners. Taking medicines  such as aspirin and ibuprofen. These medicines can thin your blood. Do not take these medicines unless your health care provider tells you to take them. Taking over-the-counter medicines, vitamins, herbs, and supplements. General instructions If you will be going home right after the procedure, plan to have a responsible adult: Take you home from the hospital or clinic. You will not be allowed to drive. Care for you for the time you are told. Do not use any products that contain nicotine or tobacco for at least 4 weeks before the procedure. These products include cigarettes, chewing tobacco, and vaping devices, such as e-cigarettes. If you need help quitting, ask your health care provider. Ask your health care provider: How your surgery site will be marked. What steps will be taken to help prevent infection. These may include: Removing hair at the surgery site. Washing skin with a germ-killing soap. Taking antibiotic medicine. What happens during the procedure?  An IV will be inserted into one of your veins. You will be given one or both of the following: A medicine to help you relax (sedative). A medicine to make you fall asleep (general anesthetic). Your surgeon will make several small incisions in your abdomen. The laparoscope will be inserted through one of the small incisions. The camera on the laparoscope will send images to a monitor in the operating room. This lets your surgeon see inside your abdomen. A gas will be pumped into your abdomen. This will expand your abdomen to give the surgeon more  room to perform the surgery. Other tools that are needed for the procedure will be inserted through the other incisions. The gallbladder will be removed through one of the incisions. Your common bile duct may be examined. If stones are found in the common bile duct, they may be removed. After your gallbladder has been removed, the incisions will be closed with stitches (sutures), staples, or  skin glue. Your incisions will be covered with a bandage (dressing). The procedure may vary among health care providers and hospitals. What happens after the procedure? Your blood pressure, heart rate, breathing rate, and blood oxygen level will be monitored until you leave the hospital or clinic. You will be given medicines as needed to control your pain. You may have a drain placed in the incision. The drain will be removed a day or two after the procedure. Summary Minimally invasive cholecystectomy, also called laparoscopic cholecystectomy, is surgery to remove the gallbladder using small incisions. Tell your health care provider about all the medical conditions you have and all the medicines you are taking for those conditions. Before the procedure, follow instructions about when to stop eating and drinking and changing or stopping medicines. Plan to have a responsible adult care for you for the time you are told after you leave the hospital or clinic. This information is not intended to replace advice given to you by your health care provider. Make sure you discuss any questions you have with your health care provider. Document Revised: 10/09/2020 Document Reviewed: 10/09/2020 Elsevier Patient Education  2023 Elsevier Inc.   Biliary Dyskinesia  Biliary dyskinesia is a condition in which the gallbladder or bile ducts cannot release or move bile normally. Bile is a fluid that is made in the liver to help the body digest food. Bile flows to the gallbladder to be stored. When bile is needed for digestion, it leaves the gallbladder and flows through the bile ducts into the digestive tract. Biliary dyskinesia causes bile to build up, and that can cause pain in the abdomen. This condition may also be called: Acalculous cholecystopathy. Functional gallbladder disorder. Sphincter of Oddi dysfunction. This is one type of biliary dyskinesia. What are the causes? The cause of biliary dyskinesia is  poor function of the gallbladder. The exact reason this happens is often unknown. One reason may be changes in the gallbladder that are caused by obesity. What increases the risk? A person is more likely to develop this condition if his or her mother or father had it. It may also develop if the person is: Overweight. Male. 82-47 years old. What are the signs or symptoms? The main symptom of this condition is pain in the upper right side of the abdomen. Typically, the pain: Starts about 30 minutes after a meal, especially a meal that is spicy or greasy. Lasts for 30 minutes or longer. Builds up gradually until it is a steady pain that is severe enough to interrupt daily activities. Other symptoms may include: Nausea or vomiting. Sweating. Cramping or bloating in the abdomen. Heartburn or belching. Diarrhea. How is this diagnosed? This condition may be diagnosed based on your symptoms, your medical history, and a physical exam. You may have tests to rule out other conditions and to confirm the diagnosis. Tests may include: Blood tests. Ultrasound tests of the gallbladder. Hepatobiliary iminodiacetic acid (HIDA) scan. This is an X-ray test that can show if your gallbladder empties less than a normal amount of bile (gallbladder ejection fraction). MRI or CT scan of the  abdomen. ERCP (endoscopic retrograde cholangiopancreatogram). During this procedure, a thin tube with a camera on the end is inserted into the throat and down into areas that need to be examined, such as the pancreas, bile ducts, liver, and gallbladder. Dye is injected into your blood, and then X-rays are done. The dye helps your health care provider see the areas to be examined. ERCP may be done to help diagnose sphincter of Oddi dysfunction. How is this treated? Treatment depends on the cause. Usually, the first step of treatment is to make lifestyle changes. Your health care provider may recommend: Resting. Losing  weight. Avoiding foods that are spicy, greasy, or fatty. Taking over-the-counter or prescription pain medicine. In some cases, the condition gets better with lifestyle changes only. However, in many cases, surgery to remove the gallbladder (cholecystectomy) is needed. Follow these instructions at home: Medicines Take over-the-counter and prescription medicines only as told by your health care provider. Ask your health care provider if the medicine prescribed to you: Requires you to avoid driving or using machinery. Can cause constipation. You may need to take these actions to prevent or treat constipation: Drink enough fluid to keep your urine pale yellow. Take over-the-counter or prescription medicines. Eat foods that are high in fiber, such as beans, whole grains, and fresh fruits and vegetables. Limit foods that are high in fat and processed sugars, such as fried or sweet foods. Alcohol use Alcohol can irritate your stomach and your liver. If you drink alcohol: Limit how much you have to: 0-1 drink a day for women who are not pregnant. 0-2 drinks a day for men. Know how much alcohol is in a drink. In the [place] one drink equals one 12 oz bottle of beer (355 mL), one 5 oz glass of wine (148 mL), or one 1 oz glass of hard liquor (44 mL). General instructions Rest and return to your normal activities as told by your health care provider. Ask your health care provider what activities are safe for you. Follow instructions from your health care provider about eating or drinking restrictions. You may need to limit fatty, greasy, and spicy foods if they cause symptoms. Do not use any products that contain nicotine or tobacco. These products include cigarettes, chewing tobacco, and vaping devices, such as e-cigarettes. If you need help quitting, ask your health care provider. Smoking can damage your digestive system. Keep all follow-up visits. This is important. Contact a health care provider  if: Pain in your abdomen returns. Your symptoms become more severe or continue for more than 3 months. You have any of the following: Nausea or vomiting. Diarrhea. Cramping or bloating in your abdomen. Summary Biliary dyskinesia is a condition in which your gallbladder or bile ducts cannot release or move bile normally. The main symptom of this condition is pain in the upper right side of the abdomen. The pain typically starts about 30 minutes after a meal, especially a meal that is spicy or greasy. Treatment depends on the cause and usually involves lifestyle changes, such as working to lose weight and avoiding certain foods. In many cases, surgery to remove the gallbladder (cholecystectomy) is needed. This information is not intended to replace advice given to you by your health care provider. Make sure you discuss any questions you have with your health care provider. Document Revised: 02/01/2020 Document Reviewed: 02/01/2020 Elsevier Patient Education  2023 [employer]

## 2022-01-15 NOTE — H&P (Signed)
[facility] History and Physical  Reason for Referral: Biliary Dyskinesia  Referring Physician: [name], NP   Chief Complaint   New Patient (Initial Visit)     Keith Hale is a 41 y.o. male.  HPI: Keith Hale is today with a interpretor. He has a history of gastritis and ulcer in 2020 and had this managed by GI. He has been on a PPI and Carafate and was last scoped 11/2020 where he was noted to still have some gastritis. He has been having some abdominal pain in the epigastric region and some soreness on the right side. He had a normal [place] and a HIDA that was positive for biliary dyskinesia. He has had nausea but no vomiting.    Past Medical History:  Diagnosis Date   GI hemorrhage 11/01/2018   duodenal ulcer with visible vessel   Helicobacter pylori gastritis 10/2018   treated with amoxicillin, clarithromycin, pantoprazole.  Follow-up stool test negative in November 2020.    Past Surgical History:  Procedure Laterality Date   APPENDECTOMY     BIOPSY  11/01/2018   Procedure: BIOPSY;  Surgeon: West Bali, MD;  Location: [facility];  Service: Endoscopy;;   BIOPSY  12/07/2020   Procedure: BIOPSY;  Surgeon: Lanelle Bal, DO;  Location: [facility];  Service: Endoscopy;;   ESOPHAGOGASTRODUODENOSCOPY (EGD) WITH PROPOFOL N/A 11/01/2018   Surgeon: West Bali, MD;  duodenal ulcer with visible vessel and H. pylori gastritis   ESOPHAGOGASTRODUODENOSCOPY (EGD) WITH PROPOFOL N/A 12/07/2020   Surgeon: Lanelle Bal, DO; gastritis with biopsies revealing slight chronic inflammation and negative for H. pylori, otherwise normal exam.   TONSILLECTOMY      Family History  Problem Relation Age of Onset   Hypertension Mother    Colon cancer Neg Hx     Social History   Tobacco Use   Smoking status: Former    Packs/day: 0.00    Years: 5.00    Total pack years: 0.00    Types: Cigarettes    Quit date: 04/21/2017    Years since quitting: 4.7     Passive exposure: Past   Smokeless tobacco: Never   Tobacco comments:    previously smoked about 2 cig/ month.  Vaping Use   Vaping Use: Never used  Substance Use Topics   Alcohol use: Not Currently   Drug use: No    Medications: I have reviewed the patient's current medications. Allergies as of 01/14/2022   No Known Allergies      Medication List        Accurate as of January 14, 2022 10:55 AM. If you have any questions, ask your nurse or doctor.          pantoprazole 40 MG tablet Commonly known as: PROTONIX Take 1 tablet (40 mg total) by mouth 2 (two) times daily before a meal.   sucralfate 1 g tablet Commonly known as: CARAFATE Take 1 tablet (1 g total) by mouth 4 (four) times daily -  with meals and at bedtime.         ROS:  A comprehensive review of systems was negative except for: Gastrointestinal: positive for abdominal pain, nausea, reflux symptoms, and vomiting  Blood pressure 119/79, pulse (!) 53, temperature 97.9 F (36.6 C), temperature source Oral, resp. rate 12, height 5\' 7"  (1.702 m), weight 196 lb (88.9 kg), SpO2 99 %. Physical Exam Vitals reviewed.  Constitutional:      Appearance: Normal appearance.  HENT:  Head: Normocephalic.     Nose: Nose normal.  Eyes:     Extraocular Movements: Extraocular movements intact.  Cardiovascular:     Rate and Rhythm: Normal rate and regular rhythm.  Pulmonary:     Effort: Pulmonary effort is normal.     Breath sounds: Normal breath sounds.  Abdominal:     General: There is no distension.     Palpations: Abdomen is soft.     Tenderness: There is generalized abdominal tenderness and tenderness in the right upper quadrant.  Musculoskeletal:        General: Normal range of motion.     Cervical back: Normal range of motion.  Skin:    General: Skin is warm.  Neurological:     General: No focal deficit present.     Mental Status: He is alert and oriented to person, place, and time.  Psychiatric:         Mood and Affect: Mood normal.        Behavior: Behavior normal.     Results: personally reviewed -decreased ejection, no stones  CLINICAL DATA:  Postprandial epigastric abdominal pain.   EXAM: NUCLEAR MEDICINE HEPATOBILIARY IMAGING WITH GALLBLADDER EF   TECHNIQUE: Sequential images of the abdomen were obtained out to 60 minutes following intravenous administration of radiopharmaceutical. After oral ingestion of Ensure, gallbladder ejection fraction was determined. At 60 min, normal ejection fraction is greater than 33%.   RADIOPHARMACEUTICALS:  5.0 mCi Tc-65m  Choletec IV   COMPARISON:  Right upper quadrant ultrasound July 26, 2021   FINDINGS: Prompt uptake and biliary excretion of activity by the liver is seen. Gallbladder activity is visualized, consistent with patency of cystic duct. Biliary activity passes into small bowel, consistent with patent common bile duct.   Calculated gallbladder ejection fraction is 16%. (Normal gallbladder ejection fraction with Ensure is greater than 33%.)   IMPRESSION: Reduced gallbladder ejection fraction as can be seen with chronic cholecystitis/biliary dyskinesia.     Electronically Signed   By: Maudry Mayhew M.D.   On: 09/10/2021 14:49   CLINICAL DATA:  Epigastric abdominal pain   EXAM: ULTRASOUND ABDOMEN LIMITED RIGHT UPPER QUADRANT   COMPARISON:  CT abdomen pelvis 10/07/2020   FINDINGS: Gallbladder:   Gallstones: None   Sludge: None   Gallbladder Wall: Within normal limits   Pericholecystic fluid: None   Sonographic Murphy's Sign: Negative per technologist   Common bile duct:   Diameter: 3 mm   Liver:   Parenchymal echogenicity: Within normal limits   Lesions: None   Portal vein: Patent.  Hepatopetal flow   Other: None.   IMPRESSION: No significant sonographic abnormality of the liver or gallbladder.     Electronically Signed   By: Acquanetta Belling M.D.   On: 07/27/2021 10:48     Assessment &  Plan:  Keith Hale is a 41 y.o. male with biliary dyskinesia. Prior diagnosis of ulcer and gastritis with improved EGD in 2022 without ulcer. He is having soreness in the RUQ and pain in the epigastric region with some nausea/ bloating.   -PLAN: I counseled the patient about the indication, risks and benefits of laparoscopic cholecystectomy.  He understands there is a very small chance for bleeding, infection, injury to normal structures (including common bile duct), conversion to open surgery, persistent symptoms, evolution of postcholecystectomy diarrhea, need for secondary interventions, anesthesia reaction, cardiopulmonary issues and other risks not specifically detailed here. I described the expected recovery, the plan for follow-up and the restrictions during the  recovery phase.  All questions were answered. Discussed that if he is still having pain it could be from the gastritis and he may need another EGD.      Virl Cagey 01/14/2022, 10:55 AM

## 2022-01-20 NOTE — Patient Instructions (Signed)
Your procedure is scheduled on: 01/24/2022  Report to [facility] at    6:00 AM.  Call this number if you have problems the morning of surgery: [phone]   Remember:   Do not Eat or Drink after midnight         No Smoking the morning of surgery  :  Take these medicines the morning of surgery with A SIP OF WATER: Pantoprazole   Do not wear jewelry, make-up or nail polish.  Do not wear lotions, powders, or perfumes. You may wear deodorant.  Do not shave 48 hours prior to surgery. Men may shave face and neck.  Do not bring valuables to the hospital.  Contacts, dentures or bridgework may not be worn into surgery.  Leave suitcase in the car. After surgery it may be brought to your room.  For patients admitted to the hospital, checkout time is 11:00 AM the day of discharge.   Patients discharged the day of surgery will not be allowed to drive home.    Special Instructions: Shower using CHG night before surgery and shower the day of surgery use CHG.  Use special wash - you have one bottle of CHG for all showers.  You should use approximately 1/2 of the bottle for each shower.  Cmo usar clorhexidina para baarse How to Use Chlorhexidine Before Surgery El gluconato de clorhexidina (CHG) es una solucin desinfectante (antisptica) que se utiliza para limpiar la piel. Puede eliminar las bacterias que normalmente viven en la piel y mantenerlas alejadas durante aproximadamente 24 horas. Para limpiarse la piel con CHG, es posible que le den lo siguiente: Una solucin de CHG para usar en la ducha o como parte de un bao de [place]. Un pao preenvasado que contenga CHG. Limpiar la piel con CHG puede ayudar a disminuir el riesgo de infeccin: Mientras permanece en la unidad de cuidados intensivos del hospital. Si tiene un acceso vascular, como una va central, para proporcionar acceso a corto o largo plazo a las venas. Si tiene un catter para que drene orina de la vejiga. Si tiene  Animal nutritionist. El respirador es un aparato que lo ayuda a Ambulance person al hacer que el aire entre y salga de sus pulmones. Despus de [place]. Cules son los riesgos? Los riesgos de usar de CHG incluyen los siguientes: Una reaccin en la piel. Prdida de audicin si el CHG entra en sus odos y tiene un tmpano perforado. Lesin ocular si el CHG ingresa en los ojos y no se enjuaga. El CHG es inflamable. Asegrese de no fumar ni acercarse al fuego luego de aplicarse CHG en la piel. No use CHG: Si es alrgico a la clorhexidina o anteriormente tuvo una reaccin a la clorhexidina. En bebs de menos de 2 meses. Cmo usar la solucin de CHG Use CHG nicamente como se lo indique su mdico y siga las instrucciones de la etiqueta. Use la cantidad de CHG que le indicaron. Usualmente, la medida es una botella. Durante una ducha Siga estos pasos al usar la solucin de CHG durante una ducha (a menos que su mdico le brinde instrucciones diferentes): Comience a ducharse. Lvese la cara y el cabello con el jabn y el champ que utiliza habitualmente. Cierre la [place] o salga de abajo del agua. Vierta el CHG en un pao limpio. No use ningn cepillo o esponja con bordes rugosos. Comience en el cuello y colquese la espuma en todo el cuerpo [employer]. Asegrese de seguir estas instrucciones:  Si le harn [place], preste especial atencin a la parte del cuerpo donde Economist. Frote la zona durante al menos 1 minuto. No use el CHG en su cabeza o cara. Si la solucin [employer] odos o los ojos, enjuague bien con [place]. Evite la zona genital. Evite las zonas de la piel que estn lastimadas o tengan cortes o raspaduras. Frote su espalda y [employer]. Asegrese de lavar los pliegues de la piel. Deje actuar la espuma por 1 o 2 minutos, o por el [employer] haya indicado el mdico. Enjuguese todo el cuerpo bajo la ducha. Asegrese de enjuagar bien todos los pliegues y  hendiduras de la piel. Seque con una toalla limpia. Despus no aplique ninguna sustancia en el cuerpo, como polvo, locin o perfume, excepto que se lo indique el mdico. Solo use las lociones recomendadas por el fabricante. Pngase pijama o ropa limpia. Si es la noche anterior a la ciruga, duerma en sbanas limpias.  Durante un bao de esponja Siga estos pasos al usar la solucin de CHG durante un bao de Psychologist, clinical (a menos que su mdico le brinde instrucciones diferentes): [employer] cara y el cabello con el jabn y el champ que utiliza habitualmente. Vierta el CHG en un pao limpio. Comience en el cuello y colquese la espuma en todo el cuerpo [employer]. Asegrese de seguir estas instrucciones: Si le harn una ciruga, preste especial atencin a la parte del cuerpo Dance movement psychotherapist. Frote la zona durante al menos 1 minuto. No use el CHG en su cabeza o cara. Si la solucin [employer] odos o los ojos, enjuague bien con [place]. Evite la zona genital. Evite las zonas de la piel que estn lastimadas o tengan cortes o raspaduras. Frote su espalda y [employer]. Asegrese de lavar los pliegues de la piel. Deje actuar la espuma por 1 o 2 minutos, o por el tiempo que le haya indicado el mdico. Con un pao limpio y hmedo diferente, enjuguese bien todo el cuerpo. Asegrese de enjuagar bien todos los pliegues y hendiduras de la piel. Seque con una toalla limpia. Despus no aplique ninguna sustancia en el cuerpo, como polvo, locin o perfume, excepto que se lo indique el mdico. Solo use las lociones recomendadas por el fabricante. Pngase pijama o ropa limpia. Si es la noche anterior a la ciruga, duerma en sbanas limpias. [employer] paos preenvasados de CHG Use los paos de CHG nicamente como se lo haya indicado el mdico y siga las instrucciones de la etiqueta. Use los paos de CHG sobre la piel limpia y [place]. No use el pao de CHG en la cabeza o el rostro a menos  que el mdico se lo indique. Cuando lave con el pao de CHG: Evite la zona genital. Evite las zonas de la piel que estn lastimadas o tengan cortes o raspaduras. Antes de la ciruga Siga estos pasos al usar un pao de CHG para limpiar antes de [place] (a menos que su mdico le brinde instrucciones diferentes): Con el pao de CHG, frtese con firmeza la parte del cuerpo donde le realizarn la [place]. Frtese de un lado al otro durante 3 minutos. El rea del cuerpo debe estar completamente hmeda con CHG cuando termine de frotar. No enjuague. Deseche el pao y deje que el rea se seque al aire. Despus no aplique ninguna sustancia sobre el rea, como polvos, lociones o perfume. Pngase pijama  o ropa limpia. Si es la noche anterior a la ciruga, duerma en sbanas limpias.  Para baarse en general Siga estos pasos al usar el pao de CHG para baarse en general (a menos que su mdico le brinde instrucciones diferentes). Use un pao de CHG diferente en cada rea del cuerpo. Asegrese de Foot Lockerlavarse entre los pliegues de la piel y Taylorent[place] los dedos de las manos y de los pies. Lvese el cuerpo en el siguiente orden, usando un pao nuevo despus de cada paso: La parte delantera del cuello, los hombros y Naval architectel pecho. Ambos brazos, debajo de los brazos y las [place]manos. El estmago y la zona de la ingle, evitando los genitales. La pierna y el pie derechos. La pierna y el pie izquierdos. La parte posterior del cuello, la espalda y las nalgas. No enjuague. Deseche el pao y deje que el rea se seque al aire. Despus no aplique ninguna sustancia en el cuerpo, como polvo, locin o perfume, excepto que se lo indique el mdico. Solo use las lociones recomendadas por el fabricante. Pngase pijama o ropa limpia. Comunquese con un mdico si: La piel se irrita despus de frotarla. Tiene preguntas sobre cmo usar la solucin o el pao. Traga un poco de clorhexidina. Llame al centro de toxicologa local [phone] en  los [place]). Solicite ayuda de inmediato si: Los ojos le [id number]pican mucho, o se ponen muy rojos o se le hinchan. Siente picazn intensa en la piel y est roja o hinchada. Nota cambios en su audicin. Tiene dificultad para ver. Tiene hinchazn u hormigueo en la garganta o la boca. Tiene dificultad para respirar. Estos sntomas pueden representar un problema grave que constituye Radio broadcast assistantuna emergencia. No espere a ver si los sntomas desaparecen. Solicite atencin mdica de inmediato. Comunquese con el servicio de emergencias de su localidad (911 en los [place]). No conduzca por sus propios medios OfficeMax Incorporatedhasta el hospital. Resumen El gluconato de clorhexidina (CHG) es una solucin desinfectante (antisptica) que se utiliza para limpiar la piel. Limpiar la piel con CHG puede ayudar a disminuir el riesgo de infeccin. Es posible que le indiquen que utilice CHG para baarse. Esta solucin puede venir en botella o en un pao preenvasado para que use sobre su piel. Siga cuidadosamente las instrucciones del mdico y las instrucciones que se encuentran en la etiqueta del producto. No use CHG si es alrgico a la clorhexidina. Pngase en contacto con su mdico si se le irrita la piel luego de frotar. Esta informacin no tiene Theme park managercomo fin reemplazar el consejo del mdico. Asegrese de hacerle al mdico cualquier pregunta que tenga. Document Revised: 07/10/2020 Document Reviewed: 07/10/2020 Elsevier Patient Education  2023 Elsevier Inc. Colecistectoma mnimamente invasiva, cuidados posteriores Minimally Invasive Cholecystectomy, Care After Qu puedo esperar despus del procedimiento? Despus del procedimiento, es comn DIRECTVtener los siguientes sntomas: Financial risk analystentir dolor en las zonas de la [place]. Le darn medicamentos para Chief Technology Officerel dolor. Vomitar o tener nuseas. Sentir el vientre lleno (meteorismo) o Surveyor, miningtener dolor en el hombro. Esto se debe al gas que se us durante la [place]ijanciruga. Siga estas instrucciones en su  casa: Medicamentos Use los medicamentos de venta libre y los recetados solamente como se lo haya indicado el mdico. Si le recetaron un antibitico, tmelo como se lo haya indicado el mdico. No deje de tomarlo aunque comience a sentirse mejor. Si se lo indican, tome medidas a fin de prevenir problemas para ir de cuerpo (estreimiento). Es posible que deba hacer lo siguiente: Product managerBeber suficiente lquido para Radio producermantener el pis (  la orina) de color amarillo plido. Tomar medicamentos. Le dirn qu medicamentos debe tomar. Comer alimentos ricos en fibra. Entre ellos, frijoles, cereales integrales y frutas y verduras frescas. Limitar los alimentos con alto contenido de grasa y International aid/development worker. Estos incluyen alimentos fritos o dulces. Pregunte al mdico si debe evitar conducir o Chemical engineer mquinas mientras toma los medicamentos. Cuidado de la incisin  Siga las instrucciones del mdico en lo que respecta al cuidado de los cortes de la ciruga (incisiones). Asegrese de hacer lo siguiente: Lvese las manos con agua y jabn durante al menos 20 segundos antes y despus de cambiarse la venda (vendaje). Use un desinfectante para manos si no dispone de [place] y [place]. Cmbiese la venda. Deje los puntos (suturas) o la goma para cerrar la piel en su lugar durante al menos 2 semanas. Deje colocadas las tiras de [place] a menos que se le indique que se las quite. Puede recortar los bordes de las tiras de cinta si se enrollan. No tome baos de inmersin, no practique natacin ni utilice el jacuzzi. Pregunte a su mdico si puede ducharse o darse baos de esponja. Controle la zona de la incisin todos los [address]  [address] para detectar signos de infeccin. Est atento a los siguientes signos: Aumento del enrojecimiento, la hinchazn o Chief Technology Officer. Lquido o sangre. Calor. Pus o mal olor. Actividad Haga reposo como se lo haya indicado el mdico. No realice actividades que requieran mucho esfuerzo. Levntese y camine un poco cada 1 a 2  horas. Pida ayuda si se siente dbil o inestable. No levante objetos que pesen ms de 10 libras (4.5 kg) o que sean ms pesados de lo que le indicaron. No practique deportes de contacto hasta que el mdico lo autorice. No retome el trabajo ni el estudio hasta que el mdico lo autorice. Retome sus actividades normales cuando el mdico le diga que es seguro. Instrucciones generales Si le administraron un sedante durante el procedimiento, no conduzca ni use mquinas hasta que el mdico le indique que es seguro [place]. Un sedante es un medicamento que ayuda a [place]. Concurra a todas las visitas de seguimiento. Comunquese con un mdico si: Aparece una erupcin cutnea. Aumentan el enrojecimiento, la hinchazn o el dolor alrededor de las incisiones. Le sale lquido o sangre de las incisiones. Las incisiones estn calientes al tacto. Tiene pus o percibe que sale mal olor del lugar de las incisiones. Tiene fiebre. Una o ms de las incisiones se abren. Solicite ayuda de inmediato si: Tiene dificultad para respirar. Siente dolor en el pecho. Siente dolor que empeora en la zona de los hombros. Se desmaya o se siente mareado al ponerse de pie. Tiene dolor muy intenso en el vientre (abdomen). Siente que va a vomitar o vomita y esto dura ms de Civil engineer, contracting. Siente dolor en la pierna. Estos sntomas pueden Customer service manager. Solicite ayuda de inmediato. Llame al 911. No espere a ver si los sntomas desaparecen. No conduzca por sus propios medios OfficeMax Incorporated. Resumen Despus de la [place], es comn sentir dolor en las zonas de la [place]. Tambin puede tener vmitos o sentir llenura en el vientre. Siga las instrucciones del mdico acerca de los medicamentos, la restriccin de actividades y el cuidado de las zonas de la [place]. No realice actividades que requieran mucho esfuerzo. Comunquese con el mdico si tiene fiebre u otros signos de infeccin, como ms enrojecimiento, hinchazn o  dolor alrededor Microsoft. Busque ayuda de inmediato si tiene dolor de [place]  en los hombros que va en aumento o problemas para respirar. Esta informacin no tiene Theme park manager el consejo del mdico. Asegrese de hacerle al mdico cualquier pregunta que tenga. Document Revised: 10/24/2020 Document Reviewed: 10/24/2020 Elsevier Patient Education  2023 Elsevier Inc. Anestesia general en adultos, cuidados posteriores General Anesthesia, Adult, Care After La siguiente informacin ofrece orientacin sobre cmo cuidarse despus del procedimiento. El mdico tambin podr darle instrucciones ms especficas. Comunquese con el mdico si tiene problemas o preguntas. Qu puedo esperar despus del procedimiento? Despus del procedimiento, es normal sentir o tener lo siguiente: Dolor o Social worker de la va intravenosa (i.v.). Nuseas o vmitos. Dolor de Advertising copywriter o ronquera. Dificultad para concentrarse. Fro o escalofros. Debilidad, somnolencia o cansancio (fatiga). Malestar y Tourist information centre manager. Estos sntomas pueden afectar partes del cuerpo que no estuvieron involucradas en la ciruga. Siga estas indicaciones en su casa: Durante el perodo de [employer] le haya indicado el mdico:  Descanse. No participe en actividades que impliquen posibles cadas o lesiones. No conduzca ni opere maquinaria. No beba alcohol. No tome somnferos ni medicamentos que causen somnolencia. No tome decisiones trascendentes ni firme documentos importantes. No cuide a nios por su cuenta. Indicaciones generales [place] suficiente lquido como para mantener la orina de color amarillo plido. Si tiene apnea del sueo, la [place] y ciertos medicamentos pueden elevar su riesgo de tener problemas respiratorios. Siga las instrucciones del mdico respecto al uso del dispositivo para dormir: Siempre que duerma, incluso durante las siestas que tome en [employer]. Mientras tome analgsicos recetados,  medicamentos para dormir o medicamentos que producen somnolencia. Retome sus actividades normales segn lo indicado por el mdico. Pregntele al mdico qu actividades son seguras para usted. Use los medicamentos de venta libre y los recetados solamente como se lo haya indicado el mdico. No consuma ningn producto que contenga nicotina o tabaco. Estos productos incluyen cigarrillos, tabaco para Theatre manager y aparatos de vapeo, como los Administrator, Civil Service. Estos pueden retrasar la cicatrizacin de la incisin despus de la ciruga. Si necesita ayuda para dejar de consumir esos productos, consulte al mdico. Comunquese con un mdico si: Tiene nuseas o vmitos que no mejoran con medicamentos. Vomita cada vez que come o bebe. Su dolor no se alivia con medicamentos. No puede orinar o ve [employer]. Tiene una erupcin cutnea. Tiene fiebre. Solicite ayuda de inmediato si: Tiene dificultad para respirar. Siente dolor en el pecho. Vomita sangre. Estos sntomas pueden Customer service manager. Solicite ayuda de inmediato. Llame al 911. No espere a ver si los sntomas desaparecen. No conduzca por sus propios medios [employer] Incorporated. Resumen Despus del procedimiento, es comn tener dolor de garganta, ronquera, nuseas, vmitos o sentir debilidad, somnolencia o fatiga. Durante el perodo de tiempo que le haya indicado el mdico, no conduzca ni use [place]. Busque ayuda de inmediato si le cuesta respirar, siente dolor en el pecho o vomita sangre. Estos sntomas pueden Customer service manager. Esta informacin no tiene Theme park manager el consejo del mdico. Asegrese de hacerle al mdico cualquier pregunta que tenga. Document Revised: 08/01/2021 Document Reviewed: 08/01/2021 Elsevier Patient Education  2023 [employer].

## 2022-01-22 ENCOUNTER — Encounter (HOSPITAL_COMMUNITY): Payer: Self-pay

## 2022-01-22 ENCOUNTER — Encounter (HOSPITAL_COMMUNITY)
Admission: RE | Admit: 2022-01-22 | Discharge: 2022-01-22 | Disposition: A | Discharge status: Home / Self Care | Payer: Self-pay | Source: Ambulatory Visit | Attending: General Surgery | Admitting: General Surgery

## 2022-01-22 DIAGNOSIS — K264 Chronic or unspecified duodenal ulcer with hemorrhage: Secondary | ICD-10-CM | POA: Insufficient documentation

## 2022-01-22 DIAGNOSIS — Z01818 Encounter for other preprocedural examination: Secondary | ICD-10-CM | POA: Insufficient documentation

## 2022-01-22 DIAGNOSIS — K828 Other specified diseases of gallbladder: Secondary | ICD-10-CM | POA: Insufficient documentation

## 2022-01-22 LAB — COMPREHENSIVE METABOLIC PANEL
ALT: 52 U/L — ABNORMAL HIGH (ref 0–44)
AST: 28 U/L (ref 15–41)
Albumin: 4.2 g/dL (ref 3.5–5.0)
Alkaline Phosphatase: 60 U/L (ref 38–126)
Anion gap: 9 (ref 5–15)
BUN: 13 mg/dL (ref 6–20)
CO2: 22 mmol/L (ref 22–32)
Calcium: 8.8 mg/dL — ABNORMAL LOW (ref 8.9–10.3)
Chloride: 106 mmol/L (ref 98–111)
Creatinine, Ser: 0.72 mg/dL (ref 0.61–1.24)
GFR, Estimated: >60 mL/min (ref >60)
Glucose, Bld: 92 mg/dL (ref 70–99)
Potassium: 3.6 mmol/L (ref 3.5–5.1)
Sodium: 137 mmol/L (ref 135–145)
Total Bilirubin: 0.6 mg/dL (ref 0.3–1.2)
Total Protein: 7.7 g/dL (ref 6.5–8.1)

## 2022-01-22 LAB — CBC WITH DIFFERENTIAL/PLATELET
Abs Immature Granulocytes: 0.01 10*3/uL (ref 0.00–0.07)
Basophils Absolute: 0.1 10*3/uL (ref 0.0–0.1)
Basophils Relative: 1 %
Eosinophils Absolute: 0.2 10*3/uL (ref 0.0–0.5)
Eosinophils Relative: 3 %
HCT: 45 % (ref 39.0–52.0)
Hemoglobin: 14.8 g/dL (ref 13.0–17.0)
Immature Granulocytes: 0 %
Lymphocytes Relative: 36 %
Lymphs Abs: 2.2 10*3/uL (ref 0.7–4.0)
MCH: 26.6 pg (ref 26.0–34.0)
MCHC: 32.9 g/dL (ref 30.0–36.0)
MCV: 80.9 fL (ref 80.0–100.0)
Monocytes Absolute: 0.6 10*3/uL (ref 0.1–1.0)
Monocytes Relative: 9 %
Neutro Abs: 3.1 10*3/uL (ref 1.7–7.7)
Neutrophils Relative %: 51 %
Platelets: 308 10*3/uL (ref 150–400)
RBC: 5.56 MIL/uL (ref 4.22–5.81)
RDW: 13.2 % (ref 11.5–15.5)
WBC: 6.1 10*3/uL (ref 4.0–10.5)
nRBC: 0 % (ref 0.0–0.2)

## 2022-01-24 ENCOUNTER — Encounter (HOSPITAL_COMMUNITY): Payer: Self-pay | Admitting: General Surgery

## 2022-01-24 ENCOUNTER — Ambulatory Visit (HOSPITAL_COMMUNITY)
Admission: RE | Admit: 2022-01-24 | Discharge: 2022-01-24 | Disposition: A | Discharge status: Home / Self Care | Payer: Self-pay | Attending: General Surgery | Admitting: General Surgery

## 2022-01-24 ENCOUNTER — Emergency Department (HOSPITAL_COMMUNITY)
Admission: EM | Admit: 2022-01-24 | Discharge: 2022-01-24 | Disposition: A | Discharge status: Home / Self Care | Payer: Self-pay | Attending: Emergency Medicine | Admitting: Emergency Medicine

## 2022-01-24 ENCOUNTER — Ambulatory Visit (HOSPITAL_BASED_OUTPATIENT_CLINIC_OR_DEPARTMENT_OTHER): Payer: Self-pay | Admitting: Anesthesiology

## 2022-01-24 ENCOUNTER — Other Ambulatory Visit: Payer: Self-pay

## 2022-01-24 ENCOUNTER — Ambulatory Visit (HOSPITAL_COMMUNITY): Payer: Self-pay | Admitting: Anesthesiology

## 2022-01-24 ENCOUNTER — Encounter (HOSPITAL_COMMUNITY)
Admission: RE | Disposition: A | Discharge status: Home / Self Care | Payer: Self-pay | Source: Home / Self Care | Attending: General Surgery

## 2022-01-24 DIAGNOSIS — K219 Gastro-esophageal reflux disease without esophagitis: Secondary | ICD-10-CM | POA: Insufficient documentation

## 2022-01-24 DIAGNOSIS — G8918 Other acute postprocedural pain: Secondary | ICD-10-CM | POA: Insufficient documentation

## 2022-01-24 DIAGNOSIS — K828 Other specified diseases of gallbladder: Secondary | ICD-10-CM

## 2022-01-24 DIAGNOSIS — Z8711 Personal history of peptic ulcer disease: Secondary | ICD-10-CM | POA: Insufficient documentation

## 2022-01-24 DIAGNOSIS — Z87891 Personal history of nicotine dependence: Secondary | ICD-10-CM | POA: Insufficient documentation

## 2022-01-24 DIAGNOSIS — K811 Chronic cholecystitis: Secondary | ICD-10-CM | POA: Insufficient documentation

## 2022-01-24 DIAGNOSIS — K264 Chronic or unspecified duodenal ulcer with hemorrhage: Secondary | ICD-10-CM

## 2022-01-24 HISTORY — PX: CHOLECYSTECTOMY: SHX55

## 2022-01-24 SURGERY — LAPAROSCOPIC CHOLECYSTECTOMY
Anesthesia: General | Site: Abdomen

## 2022-01-24 MED ORDER — SODIUM CHLORIDE 0.9 % IV SOLN
2.0000 g | INTRAVENOUS | Status: AC
  Administered 2022-01-24: 2 g via INTRAVENOUS
  Filled 2022-01-24: qty 2

## 2022-01-24 MED ORDER — FENTANYL CITRATE (PF) 100 MCG/2ML IJ SOLN
INTRAMUSCULAR | Status: AC
  Filled 2022-01-24: qty 2

## 2022-01-24 MED ORDER — ONDANSETRON HCL 4 MG PO TABS: 4.0000 mg | ORAL_TABLET | Freq: Three times a day (TID) | ORAL | 1 refills | Status: DC | PRN

## 2022-01-24 MED ORDER — DEXAMETHASONE SODIUM PHOSPHATE 10 MG/ML IJ SOLN
INTRAMUSCULAR | Status: DC | PRN
  Administered 2022-01-24: 10 mg via INTRAVENOUS

## 2022-01-24 MED ORDER — CHLORHEXIDINE GLUCONATE 0.12 % MT SOLN: 15.0000 mL | Freq: Once | OROMUCOSAL | Status: DC

## 2022-01-24 MED ORDER — PROPOFOL 10 MG/ML IV BOLUS
INTRAVENOUS | Status: AC
  Filled 2022-01-24: qty 20

## 2022-01-24 MED ORDER — BUPIVACAINE LIPOSOME 1.3 % IJ SUSP
INTRAMUSCULAR | Status: DC | PRN
  Administered 2022-01-24: 20 mL

## 2022-01-24 MED ORDER — SODIUM CHLORIDE 0.9 % IR SOLN
Status: DC | PRN
  Administered 2022-01-24: 1000 mL

## 2022-01-24 MED ORDER — ONDANSETRON HCL 4 MG/2ML IJ SOLN
INTRAMUSCULAR | Status: DC | PRN
  Administered 2022-01-24: 4 mg via INTRAVENOUS

## 2022-01-24 MED ORDER — LACTATED RINGERS IV SOLN: INTRAVENOUS | Status: DC

## 2022-01-24 MED ORDER — HYDROMORPHONE HCL 1 MG/ML IJ SOLN
1.0000 mg | Freq: Once | INTRAMUSCULAR | Status: AC
  Administered 2022-01-24: 1 mg via INTRAVENOUS
  Filled 2022-01-24: qty 1

## 2022-01-24 MED ORDER — ROCURONIUM BROMIDE 100 MG/10ML IV SOLN
INTRAVENOUS | Status: DC | PRN
  Administered 2022-01-24: 60 mg via INTRAVENOUS

## 2022-01-24 MED ORDER — CHLORHEXIDINE GLUCONATE CLOTH 2 % EX PADS: 6.0000 | MEDICATED_PAD | Freq: Once | CUTANEOUS | Status: DC

## 2022-01-24 MED ORDER — MIDAZOLAM HCL 5 MG/5ML IJ SOLN
INTRAMUSCULAR | Status: DC | PRN
  Administered 2022-01-24: 2 mg via INTRAVENOUS

## 2022-01-24 MED ORDER — PROPOFOL 10 MG/ML IV BOLUS
INTRAVENOUS | Status: DC | PRN
  Administered 2022-01-24: 180 mg via INTRAVENOUS

## 2022-01-24 MED ORDER — HYDROMORPHONE HCL 1 MG/ML IJ SOLN
0.2500 mg | INTRAMUSCULAR | Status: DC | PRN
  Administered 2022-01-24 (×2): 0.5 mg via INTRAVENOUS
  Filled 2022-01-24 (×2): qty 0.5

## 2022-01-24 MED ORDER — HEMOSTATIC AGENTS (NO CHARGE) OPTIME
TOPICAL | Status: DC | PRN
  Administered 2022-01-24: 1 via TOPICAL

## 2022-01-24 MED ORDER — MEPERIDINE HCL 50 MG/ML IJ SOLN: 6.2500 mg | INTRAMUSCULAR | Status: DC | PRN

## 2022-01-24 MED ORDER — BUPIVACAINE LIPOSOME 1.3 % IJ SUSP
INTRAMUSCULAR | Status: AC
  Filled 2022-01-24: qty 20

## 2022-01-24 MED ORDER — MIDAZOLAM HCL 2 MG/2ML IJ SOLN
INTRAMUSCULAR | Status: AC
  Filled 2022-01-24: qty 2

## 2022-01-24 MED ORDER — SODIUM CHLORIDE 0.9 % IV BOLUS
1000.0000 mL | Freq: Once | INTRAVENOUS | Status: AC
  Administered 2022-01-24: 1000 mL via INTRAVENOUS

## 2022-01-24 MED ORDER — ORAL CARE MOUTH RINSE: 15.0000 mL | Freq: Once | OROMUCOSAL | Status: DC

## 2022-01-24 MED ORDER — ONDANSETRON HCL 4 MG/2ML IJ SOLN
4.0000 mg | Freq: Once | INTRAMUSCULAR | Status: AC
  Administered 2022-01-24: 4 mg via INTRAVENOUS
  Filled 2022-01-24: qty 2

## 2022-01-24 MED ORDER — SUGAMMADEX SODIUM 200 MG/2ML IV SOLN
INTRAVENOUS | Status: DC | PRN
  Administered 2022-01-24: 200 mg via INTRAVENOUS

## 2022-01-24 MED ORDER — FENTANYL CITRATE (PF) 100 MCG/2ML IJ SOLN
INTRAMUSCULAR | Status: DC | PRN
  Administered 2022-01-24 (×4): 50 ug via INTRAVENOUS

## 2022-01-24 MED ORDER — LIDOCAINE HCL (CARDIAC) PF 100 MG/5ML IV SOSY
PREFILLED_SYRINGE | INTRAVENOUS | Status: DC | PRN
  Administered 2022-01-24: 80 mg via INTRAVENOUS

## 2022-01-24 MED ORDER — DEXMEDETOMIDINE HCL IN NACL 80 MCG/20ML IV SOLN
INTRAVENOUS | Status: DC | PRN
  Administered 2022-01-24: 10 ug via BUCCAL

## 2022-01-24 MED ORDER — OXYCODONE HCL 5 MG PO TABS: 5.0000 mg | ORAL_TABLET | ORAL | 0 refills | Status: DC | PRN

## 2022-01-24 MED ORDER — ONDANSETRON HCL 4 MG/2ML IJ SOLN: 4.0000 mg | Freq: Once | INTRAMUSCULAR | Status: DC | PRN

## 2022-01-24 SURGICAL SUPPLY — 41 items
ADH SKN CLS APL DERMABOND .7 (GAUZE/BANDAGES/DRESSINGS) ×1
ADH SKN CLS LQ APL DERMABOND (GAUZE/BANDAGES/DRESSINGS) ×1
APL PRP STRL LF DISP 70% ISPRP (MISCELLANEOUS) ×1
APPLIER CLIP ROT 10 11.4 M/L (STAPLE) ×1
APR CLP MED LRG 11.4X10 (STAPLE) ×1
BLADE SURG 15 STRL LF DISP TIS (BLADE) ×1 IMPLANT
BLADE SURG 15 STRL SS (BLADE) ×1
CHLORAPREP W/TINT 26 (MISCELLANEOUS) ×1 IMPLANT
CLIP APPLIE ROT 10 11.4 M/L (STAPLE) ×1 IMPLANT
CLOTH BEACON ORANGE TIMEOUT ST (SAFETY) ×1 IMPLANT
COVER LIGHT HANDLE STERIS (MISCELLANEOUS) ×2 IMPLANT
DECANTER SPIKE VIAL GLASS SM (MISCELLANEOUS) ×1 IMPLANT
DERMABOND ADVANCED .7 DNX12 (GAUZE/BANDAGES/DRESSINGS) ×1 IMPLANT
DERMABOND ADVANCED .7 DNX6 (GAUZE/BANDAGES/DRESSINGS) IMPLANT
ELECT REM PT RETURN 9FT ADLT (ELECTROSURGICAL) ×1
ELECTRODE REM PT RTRN 9FT ADLT (ELECTROSURGICAL) ×1 IMPLANT
GLOVE BIO SURGEON STRL SZ 6.5 (GLOVE) ×1 IMPLANT
GLOVE BIOGEL PI IND STRL 6.5 (GLOVE) ×1 IMPLANT
GLOVE BIOGEL PI IND STRL 7.0 (GLOVE) ×2 IMPLANT
GOWN STRL REUS W/TWL LRG LVL3 (GOWN DISPOSABLE) ×3 IMPLANT
HEMOSTAT SNOW SURGICEL 2X4 (HEMOSTASIS) ×1 IMPLANT
INST SET LAPROSCOPIC AP (KITS) ×1 IMPLANT
KIT TURNOVER KIT A (KITS) ×1 IMPLANT
MANIFOLD NEPTUNE II (INSTRUMENTS) ×1 IMPLANT
NDL INSUFFLATION 14GA 120MM (NEEDLE) ×1 IMPLANT
NEEDLE INSUFFLATION 14GA 120MM (NEEDLE) ×1 IMPLANT
NS IRRIG 1000ML POUR BTL (IV SOLUTION) ×1 IMPLANT
PACK LAP CHOLE LZT030E (CUSTOM PROCEDURE TRAY) ×1 IMPLANT
PAD ARMBOARD 7.5X6 YLW CONV (MISCELLANEOUS) ×1 IMPLANT
SET BASIN LINEN APH (SET/KITS/TRAYS/PACK) ×1 IMPLANT
SET TUBE SMOKE EVAC HIGH FLOW (TUBING) ×1 IMPLANT
SLEEVE Z-THREAD 5X100MM (TROCAR) ×1 IMPLANT
SUT MNCRL AB 4-0 PS2 18 (SUTURE) ×2 IMPLANT
SUT VICRYL 0 UR6 27IN ABS (SUTURE) ×1 IMPLANT
SYS BAG RETRIEVAL 10MM (BASKET) ×1
SYSTEM BAG RETRIEVAL 10MM (BASKET) ×1 IMPLANT
TROCAR Z-THRD FIOS HNDL 11X100 (TROCAR) ×1 IMPLANT
TROCAR Z-THREAD FIOS 5X100MM (TROCAR) ×1 IMPLANT
TROCAR Z-THREAD SLEEVE 11X100 (TROCAR) ×1 IMPLANT
TUBE CONNECTING 12X1/4 (SUCTIONS) ×1 IMPLANT
WARMER LAPAROSCOPE (MISCELLANEOUS) ×1 IMPLANT

## 2022-01-24 NOTE — Discharge Instructions (Signed)
Instrucciones de [place] Laparoscpica de [place]:  Quejas comunes: El dolor en el hombro derecho es comn despus de la ciruga laparoscpica. Esto es secundario a que el gas utilizado en la ciruga queda atrapado debajo del diafragma. Camine para ayudar a su cuerpo a absorber el gas. Esto mejorar en [employer]. El dolor en los sitios de los puertos es comn, Haematologist en los sitios de los puertos ms grandes. Esto mejorar con [employer]. Algunas nuseas son comunes y falta de apetito. El objetivo principal es mantenerse hidratado los primeros das despus de la [place].  Dieta/ Actividad: Dieta segn tolerancia. Es posible que no tenga apetito, pero es importante mantenerse hidratado. Beba [address] onzas de agua al [employer]. Su apetito volver con Museum/gallery conservator. Dchese segn su rutina habitual todos los [place]. No tome duchas calientes. Tome duchas calientes que duren menos de 10 minutos. Descansa y escucha a tu cuerpo, pero no te quedes en la cama todo Medical laboratory scientific officer. Camine todos los [address] al menos 15-20 minutos. Tos profunda y moverse cada 1-2 horas en los primeros das despus de la [place]. No levante ms de 10 libras, realice flexiones excesivas, empuje, jale o se ponga en cuclillas durante 1 a 2 semanas despus de la ciruga. No toque el pegamento dermabond en los sitios de incisin. Esta pelcula de pegamento permanecer en su lugar durante 1 a 2 semanas y comenzar a despegarse. No aplique lociones ni blsamos en la incisin a menos que el Dr. Henreitta Leber se lo indique especficamente.  Expectativas de Engineer, mining y narcticos: -Despus de la ciruga tendr dolor asociado con sus incisiones y esto es normal. El dolor es muscular y [place], y mejorar con Museum/gallery conservator. -Se le recomienda y se espera que tome medicamentos no narcticos como Tylenol e ibuprofeno (cuando pueda) para tratar Chief Technology Officer, ya que mltiples modalidades pueden ayudar con el tratamiento del Engineer, mining. -Los narcticos solo se usan cuando el dolor es  intenso o hay un dolor irruptivo. -No se espera que tenga una puntuacin de dolor de 0 despus de la ciruga, ya que no podemos prevenir Chief Technology Officer. El objetivo es una puntuacin de Engineer, mining de 3-4 que le permita ser funcional, [place], caminar y Therapist, art. El Engineer, mining continuar mejorando [employer] posteriores a la [place] y depende de su [place]. -Debido a la ley de [place], solo podemos dar una cierta cantidad de analgsicos para tratar Chief Technology Officer posoperatorio, y solo damos narcticos adicionales paciente por paciente. -Para la mayora de las cirugas laparoscpicas, los estudios han demostrado que la mayora de los pacientes solo necesitan de 10 a 15 pastillas narcticas, y para las [address], la mayora de los pacientes solo necesitan de 15 a 20. -Es importante tener expectativas adecuadas sobre el dolor y conocimientos sobre el manejo del dolor con medicamentos no narcticos, ya que no queremos que nadie se vuelva adicto a los Biochemist, clinical. -Usar bolsas de hielo en las primeras 48 horas y almohadillas trmicas despus de 48 horas, usar una faja abdominal (cuando se recomienda) y usar medicamentos de venta libre son formas de ayudar a Human resources officer. -Actos simples como la meditacin y las prcticas de atencin plena despus de la ciruga tambin pueden ayudar a Human resources officer y la investigacin ha demostrado el beneficio de Scientist, physiological.  Medicamento: Tome Tylenol e ibuprofeno segn sea necesario para controlar el dolor, alternando cada 4 a 6 horas. Ejemplo: Tylenol 1000 mg a las 6 am, 12 del medioda, 6 pm, 12  de la noche (no exceda los 4000 mg de tylenol por da). Ibuprofeno 800 mg a las 9 a. m., 3 p. m., 9 p. m., 3 a. m. (No exceda los 3600 mg de ibuprofeno por da). Tome Roxicodona para el dolor irruptivo cada 4 horas. Tome Colace para el estreimiento relacionado con analgsicos narcticos. Si no defeca en 2 das, tome Miralax sin  receta. Karma Greaser agua para prevenir tambin el estreimiento.  Informacin del contacto: Si tiene preguntas o inquietudes, llame a Cleotis Nipper oficina al [phone], de lunes a jueves de 8 a. m. a 5 p. m. y viernes de 8 a. m. a 12 p. m. Si es fuera del horario de atencin o durante el fin de [place], llame al Eduardo Osier [place], [phone]/ [phone], y pida hablar con el cirujano de guardia del Dr. Constance Haw en [facility].   Discharge Laparoscopic Surgery Instructions:  Common Complaints: Right shoulder pain is common after laparoscopic surgery. This is secondary to the gas used in the surgery being trapped under the diaphragm.  Walk to help your body absorb the gas. This will improve in a few days. Pain at the port sites are common, especially the larger port sites. This will improve with time.  Some nausea is common and poor appetite. The main goal is to stay hydrated the first few days after surgery.   Diet/ Activity: Diet as tolerated. You may not have an appetite, but it is important to stay hydrated. Drink 64 ounces of water a day. Your appetite will return with time.  Shower per your regular routine daily.  Do not take hot showers. Take warm showers that are less than 10 minutes. Rest and listen to your body, but do not remain in bed all day.  Walk everyday for at least 15-20 minutes. Deep cough and move around every 1-2 hours in the first few days after surgery.  Do not lift > 10 lbs, perform excessive bending, pushing, pulling, squatting for 1-2 weeks after surgery.  Do not pick at the dermabond glue on your incision sites.  This glue film will remain in place for 1-2 weeks and will start to peel off.  Do not place lotions or balms on your incision unless instructed to specifically by Dr. Constance Haw.   Pain Expectations and Narcotics: -After surgery you will have pain associated with your incisions and this is normal. The pain is muscular and nerve pain, and will get better  with time. -You are encouraged and expected to take non narcotic medications like tylenol and ibuprofen (when able) to treat pain as multiple modalities can aid with pain treatment. -Narcotics are only used when pain is severe or there is breakthrough pain. -You are not expected to have a pain score of 0 after surgery, as we cannot prevent pain. A pain score of 3-4 that allows you to be functional, move, walk, and tolerate some activity is the goal. The pain will continue to improve over the days after surgery and is dependent on your surgery. -Due to [place] law, we are only able to give a certain amount of pain medication to treat post operative pain, and we only give additional narcotics on a patient by patient basis.  -For most laparoscopic surgery, studies have shown that the majority of patients only need 10-15 narcotic pills, and for open surgeries most patients only need 15-20.   -Having appropriate expectations of pain and knowledge of pain management with non narcotics is important as we do not want  anyone to become addicted to narcotic pain medication.  -Using ice packs in the first 48 hours and heating pads after 48 hours, wearing an abdominal binder (when recommended), and using over the counter medications are all ways to help with pain management.   -Simple acts like meditation and mindfulness practices after surgery can also help with pain control and research has proven the benefit of these practices.  Medication: Take tylenol and ibuprofen as needed for pain control, alternating every 4-6 hours.  Example:  Tylenol 1000mg  @ 6am, 12noon, 6pm, (Do not exceed 4000mg  of tylenol a day). Ibuprofen 800mg  @ 9am, 3pm, 9pm, 3am (Do not exceed 3600mg  of ibuprofen a day).  Take Roxicodone for breakthrough pain every 4 hours.  Take Colace for constipation related to narcotic pain medication. If you do not have a bowel movement in 2 days, take Miralax over the counter.  Drink plenty of water  to also prevent constipation.   Contact Information: If you have questions or concerns, please call our office, [phone], Monday- Thursday 8AM-5PM and Friday 8AM-12Noon.  If it is after hours or on the weekend, please call [facility] Main Number, [phone], [phone], and ask to speak to the surgeon on call for Dr. Monday at [facility].

## 2022-01-24 NOTE — Interval H&P Note (Signed)
History and Physical Interval Note:  01/24/2022 7:24 AM  Keith Hale  has presented today for surgery, with the diagnosis of Biliary dyskinsia.  The various methods of treatment have been discussed with the patient and family. After consideration of risks, benefits and other options for treatment, the patient has consented to  Procedure(s): LAPAROSCOPIC CHOLECYSTECTOMY (N/A) as a surgical intervention.  The patient's history has been reviewed, patient examined, no change in status, stable for surgery.  I have reviewed the patient's chart and labs.  Questions were answered to the patient's satisfaction.     Virl Cagey

## 2022-01-24 NOTE — ED Provider Notes (Signed)
[facility] EMERGENCY DEPARTMENT Provider Note   CSN: [id number] Arrival date & time: 01/24/22  1718     History  Chief Complaint  Patient presents with   Post-op Problem    Keith Hale is a 41 y.o. male.  HPI 41 year old male, had a laparoscopic cholecystectomy this morning, was released doing well.  He was given a prescription for oxycodone 5 mg by mouth every 6 hours as needed, he took 1 dose but has been having ongoing pain in the right upper quadrant as well as in the right mid back throughout the day.  He has vomited twice.  He has not had any fevers.  He is not coughing or short of breath.  He has no swelling of the legs, he has not had any bowel movements but he is passing gas.    Home Medications Prior to Admission medications   Medication Sig Start Date End Date Taking? Authorizing Provider  ondansetron (ZOFRAN) 4 MG tablet Take 1 tablet (4 mg total) by mouth every 8 (eight) hours as needed. 01/24/22 01/24/23  Lucretia Roers, MD  oxyCODONE (ROXICODONE) 5 MG immediate release tablet Take 1 tablet (5 mg total) by mouth every 4 (four) hours as needed for severe pain or breakthrough pain. 01/24/22 01/24/23  Lucretia Roers, MD  pantoprazole (PROTONIX) 40 MG tablet Take 1 tablet (40 mg total) by mouth 2 (two) times daily before a meal. 07/25/21 01/24/22  Carver, Hennie Duos, DO  sucralfate (CARAFATE) 1 g tablet Take 1 tablet (1 g total) by mouth 4 (four) times daily -  with meals and at bedtime. 07/25/21 01/24/22  Lanelle Bal, DO      Allergies    Patient has no known allergies.    Review of Systems   Review of Systems  All other systems reviewed and are negative.   Physical Exam Updated Vital Signs BP 119/80 (BP Location: Right Arm)   Pulse 98   Temp 98.4 F (36.9 C) (Oral)   Resp 17   SpO2 94%  Physical Exam Vitals and nursing note reviewed.  Constitutional:      General: He is not in acute distress.    Appearance: He is well-developed.  HENT:     Head:  Normocephalic and atraumatic.     Mouth/Throat:     Pharynx: No oropharyngeal exudate.  Eyes:     General: No scleral icterus.       Right eye: No discharge.        Left eye: No discharge.     Conjunctiva/sclera: Conjunctivae normal.     Pupils: Pupils are equal, round, and reactive to light.  Neck:     Thyroid: No thyromegaly.     Vascular: No JVD.  Cardiovascular:     Rate and Rhythm: Regular rhythm. Tachycardia present.     Heart sounds: Normal heart sounds. No murmur heard.    No friction rub. No gallop.     Comments: Heart rate is 105 on my exam Pulmonary:     Effort: Pulmonary effort is normal. No respiratory distress.     Breath sounds: Normal breath sounds. No wheezing or rales.  Abdominal:     General: Bowel sounds are normal. There is no distension.     Palpations: Abdomen is soft. There is no mass.     Tenderness: There is abdominal tenderness.     Comments: Postop surgical sites look good, everything is intact, abdomen is only tender in the right upper quadrant but  no other abdominal tenderness, bowel sounds are slowed but present, he has no tympanitic sounds to percussion.  Musculoskeletal:        General: No tenderness. Normal range of motion.     Cervical back: Normal range of motion and neck supple.     Right lower leg: No edema.     Left lower leg: No edema.  Lymphadenopathy:     Cervical: No cervical adenopathy.  Skin:    General: Skin is warm and dry.     Findings: No erythema or rash.  Neurological:     Mental Status: He is alert.     Coordination: Coordination normal.  Psychiatric:        Behavior: Behavior normal.     ED Results / Procedures / Treatments   Labs (all labs ordered are listed, but only abnormal results are displayed) Labs Reviewed - No data to display  EKG None  Radiology No results found.  Procedures Procedures    Medications Ordered in ED Medications  sodium chloride 0.9 % bolus 1,000 mL (0 mLs Intravenous Stopped  01/24/22 2003)  ondansetron (ZOFRAN) injection 4 mg (4 mg Intravenous Given 01/24/22 1824)  HYDROmorphone (DILAUDID) injection 1 mg (1 mg Intravenous Given 01/24/22 1824)    ED Course/ Medical Decision Making/ A&P                           Medical Decision Making Risk Prescription drug management.   This patient presents to the ED for concern of abdominal pain after surgery, this involves an extensive number of treatment options, and is a complaint that carries with it a high risk of complications and morbidity.  The differential diagnosis includes postoperative pain seems the most likely answer given that it was a surgery that occurred within the last 10 hours.  I have discussed his care with Dr. Constance Haw the general surgeon who states that there is no complications and that the patient did very well.  Most likely the patient did not have a good understanding of his discharge instructions and has never had surgery before, his pain seems to be very localized to the right upper quadrant and he does not have any peritoneal signs or symptoms.  He is afebrile, he is not actively vomiting, his heart rate is improving   Co morbidities that complicate the patient evaluation  Recent surgery   Additional history obtained:  Additional history obtained from electronic medical record as well as discussion with the primary surgeon External records from outside source obtained and reviewed including postoperative notes, discussed with surgeon  I will forego both lab testing and imaging at this time as this patient appears well and likely just has postoperative pain   Cardiac Monitoring: / EKG:  The patient was maintained on a cardiac monitor.  I personally viewed and interpreted the cardiac monitored which showed an underlying rhythm of: Normal sinus rhythm with a mild tachycardia, this improved with IV fluids and medications   Consultations Obtained:  I requested consultation with the general  surgeon Dr. Constance Haw,  and discussed lab and imaging findings as well as pertinent plan - they recommend: Symptom control discharge and she states that she will follow-up with him tomorrow   Problem List / ED Course / Critical interventions / Medication management  This patient improved significantly, pain and nausea totally resolved, tachycardia totally resolved, the patient is afebrile I ordered medication including Zofran, IV fluids and 1 mg of Dilaudid  by IV for paina nd nausea  Reevaluation of the patient after these medicines showed that the patient improved I have reviewed the patients home medicines and have made adjustments as needed   Social Determinants of Health:  none   Test / Admission - Considered:  Considered admission but the patient is doing extremely well and feels comfortable going home, I agree, he understands indications for return         Final Clinical Impression(s) / ED Diagnoses Final diagnoses:  Post-operative pain     Eber Hong, MD 01/24/22 2052

## 2022-01-24 NOTE — Anesthesia Procedure Notes (Signed)
Procedure Name: Intubation Date/Time: 01/24/2022 7:39 AM  Performed by: Camillia Herter, RNPre-anesthesia Checklist: Patient identified, Emergency Drugs available, Suction available and Patient being monitored Patient Re-evaluated:Patient Re-evaluated prior to induction Oxygen Delivery Method: Circle system utilized Preoxygenation: Pre-oxygenation with 100% oxygen Induction Type: IV induction Ventilation: Mask ventilation without difficulty Laryngoscope Size: Miller and 2 Tube type: Oral Tube size: 7.0 mm Number of attempts: 1 Airway Equipment and Method: Stylet Placement Confirmation: ETT inserted through vocal cords under direct vision, positive ETCO2 and breath sounds checked- equal and bilateral Secured at: 24 cm Tube secured with: Tape Dental Injury: Teeth and Oropharynx as per pre-operative assessment

## 2022-01-24 NOTE — ED Triage Notes (Signed)
Pt with pain after surgery today. Last took oxycodone at 1320.  Pt had lap cholecystectomy.  Pt with emesis x 2 since home as well.

## 2022-01-24 NOTE — Op Note (Signed)
Operative Note   Preoperative Diagnosis: Biliary dyskinesia    Postoperative Diagnosis: Same   Procedure(s) Performed: Laparoscopic cholecystectomy   Surgeon: Ria Comment C. Constance Haw, MD   Assistants: No qualified resident was available   Anesthesia: General endotracheal   Anesthesiologist: Denese Killings, MD    Specimens: Gallbladder    Estimated Blood Loss: Minimal    Blood Replacement: None    Complications: None    Operative Findings: Distended gallbladder   Procedure: The patient was taken to the operating room and placed supine. General endotracheal anesthesia was induced. Intravenous antibiotics were administered per protocol. An orogastric tube positioned to decompress the stomach. The abdomen was prepared and draped in the usual sterile fashion.    A supraumbilical incision was made and a Veress technique was utilized to achieve pneumoperitoneum to 15 mmHg with carbon dioxide. A 11 mm optiview port was placed through the supraumbilical region, and a 10 mm 0-degree operative laparoscope was introduced. The area underlying the trocar and Veress needle were inspected and without evidence of injury.  Remaining trocars were placed under direct vision. Two 5 mm ports were placed in the right abdomen, between the anterior axillary and midclavicular line.  A final 11 mm port was placed through the mid-epigastrium, near the falciform ligament.    The gallbladder fundus was elevated cephalad and the infundibulum was retracted to the patient's right. The gallbladder/cystic duct junction was skeletonized. The cystic artery noted in the triangle of Calot and was also skeletonized.  We then continued liberal medial and lateral dissection until the critical view of safety was achieved.    The cystic duct and cystic artery were doubly clipped and divided. The gallbladder was then dissected from the liver bed with electrocautery. The specimen was placed in an Endopouch and was retrieved  through the epigastric site.   Final inspection revealed acceptable hemostasis. Surgical SNOW was placed in the gallbladder bed.  Trocars were removed and pneumoperitoneum was released.  0 Vicryl fascial sutures were used to close the epigastric and umbilical port sites. Skin incisions were closed with 4-0 Monocryl subcuticular sutures and Dermabond. The patient was awakened from anesthesia and extubated without complication.    Curlene Labrum, MD [facility] [address] [place], [place] [id number] [phone] (office)

## 2022-01-24 NOTE — Transfer of Care (Signed)
Immediate Anesthesia Transfer of Care Note  Patient: Keith Hale  Procedure(s) Performed: LAPAROSCOPIC CHOLECYSTECTOMY (Abdomen)  Patient Location: PACU  Anesthesia Type:General  Level of Consciousness: awake  Airway & Oxygen Therapy: Patient Spontanous Breathing  Post-op Assessment: Report given to RN  Post vital signs: Reviewed and stable  Last Vitals:  Vitals Value Taken Time  BP 135/91 01/24/22 0851  Temp 36.4 C 01/24/22 0851  Pulse 97 01/24/22 0851  Resp 37 01/24/22 0851  SpO2 98 % 01/24/22 0851  Vitals shown include unvalidated device data.  Last Pain:  Vitals:   01/24/22 0648  PainSc: 0-No pain         Complications: No notable events documented.

## 2022-01-24 NOTE — Discharge Instructions (Addendum)
You may take up to 2 tablets of oxycodone every 6 hours as needed for severe pain  You may take 1 tablet of Zofran every 6 hours as needed for nausea  Please follow-up with your general surgeon, she should be contacting you tomorrow to make sure everything is doing well.  Drink plenty of clear liquids  Return to the emergency department for severe or worsening symptoms

## 2022-01-24 NOTE — Anesthesia Preprocedure Evaluation (Signed)
Anesthesia Evaluation  Patient identified by MRN, date of birth, ID band Patient awake    Reviewed: Allergy & Precautions, NPO status , Patient's Chart, lab work & pertinent test results  Airway Mallampati: III  TM Distance: >3 FB Neck ROM: Full    Dental  (+) Dental Advisory Given, Teeth Intact   Pulmonary former smoker,    Pulmonary exam normal breath sounds clear to auscultation       Cardiovascular negative cardio ROS Normal cardiovascular exam Rhythm:Regular Rate:Normal     Neuro/Psych negative neurological ROS  negative psych ROS   GI/Hepatic Neg liver ROS, PUD, GERD  Medicated and Controlled,  Endo/Other  negative endocrine ROS  Renal/GU negative Renal ROS  negative genitourinary   Musculoskeletal negative musculoskeletal ROS (+)   Abdominal   Peds negative pediatric ROS (+)  Hematology  (+) Blood dyscrasia, anemia ,   Anesthesia Other Findings   Reproductive/Obstetrics negative OB ROS                            Anesthesia Physical Anesthesia Plan  ASA: 2  Anesthesia Plan: General   Post-op Pain Management: Dilaudid IV   Induction: Intravenous  PONV Risk Score and Plan: 4 or greater and Ondansetron, Dexamethasone and Midazolam  Airway Management Planned: Oral ETT  Additional Equipment:   Intra-op Plan:   Post-operative Plan: Extubation in OR  Informed Consent: I have reviewed the patients History and Physical, chart, labs and discussed the procedure including the risks, benefits and alternatives for the proposed anesthesia with the patient or authorized representative who has indicated his/her understanding and acceptance.     Dental advisory given  Plan Discussed with: CRNA and Surgeon  Anesthesia Plan Comments:         Anesthesia Quick Evaluation

## 2022-01-24 NOTE — Anesthesia Postprocedure Evaluation (Signed)
Anesthesia Post Note  Patient: Keith Hale  Procedure(s) Performed: LAPAROSCOPIC CHOLECYSTECTOMY (Abdomen)  Patient location during evaluation: Phase II Anesthesia Type: General Level of consciousness: awake and alert and oriented Pain management: pain level controlled Vital Signs Assessment: post-procedure vital signs reviewed and stable Respiratory status: spontaneous breathing, nonlabored ventilation and respiratory function stable Cardiovascular status: blood pressure returned to baseline and stable Postop Assessment: no apparent nausea or vomiting Anesthetic complications: no   No notable events documented.   Last Vitals:  Vitals:   01/24/22 0958 01/24/22 1001  BP: 123/81 123/80  Pulse:  78  Resp:  18  Temp: 36.6 C 36.6 C  SpO2:  96%    Last Pain:  Vitals:   01/24/22 1001  TempSrc: Oral  PainSc: 3                  [name] C Makenna Macaluso

## 2022-01-27 ENCOUNTER — Telehealth: Payer: Self-pay | Admitting: *Deleted

## 2022-01-27 LAB — SURGICAL PATHOLOGY

## 2022-01-27 NOTE — Progress Notes (Signed)
Can you call and check on him and let him know it is chronic cholecystitis on the pathology. Nothing concerning.

## 2022-01-27 NOTE — Telephone Encounter (Signed)
Surgical Date: 01/24/2022 Procedure: Lap Chole  Received VM from patient spouse, Mayra [phone]~ telephone.   Time of call noted at 01/24/2022 @ 4pm. Unfortunately, the office was closed at that time. Mayra reports that patient was having increased abdominal pain after surgery. Reports that Oxycodone 5mg  was ineffective.   Noted that patient was seen in ER on 01/24/2022 at 5:18 pm. Patient given IV fluids and Hydromorphone. Patient was then discharged home.   Attempted to contact patient to F/U. Left VM to return call.

## 2022-01-27 NOTE — Telephone Encounter (Signed)
Call placed to patient spouse, Mayra [phone]~ telephone.   Reports that patient is doing much better since being seen in ER.   States that he is taking APAP in addition to Oxy for pain management.

## 2022-01-29 ENCOUNTER — Encounter (HOSPITAL_COMMUNITY): Payer: Self-pay | Admitting: General Surgery

## 2022-02-11 ENCOUNTER — Ambulatory Visit (INDEPENDENT_AMBULATORY_CARE_PROVIDER_SITE_OTHER): Payer: Self-pay | Admitting: General Surgery

## 2022-02-11 DIAGNOSIS — K828 Other specified diseases of gallbladder: Secondary | ICD-10-CM

## 2022-02-11 NOTE — Progress Notes (Signed)
[facility]  I am calling the patient for post operative evaluation. This is not a billable encounter as it is under the [place] charges for the surgery. An interrupter was used.  The patient had a laparoscopic cholecystectomy 10/6. The patient reports that he is doing well but had similar symptoms the first few days after surgery. He was wonder about the EGD. The are tolerating a diet, having good pain control, and having regular Bms.  The incisions are healing. The patient has no others concerns. If he is still having symptoms and wants the EGD we can refer him.   Pathology: Chronic cholecystitis   Will see the patient PRN.  He will call in 2 weeks if he is not doing better to get referred back to GI for EGD.   Curlene Labrum, MD [facility] [address] [place], [place] [address] [phone] (office)

## 2022-02-21 ENCOUNTER — Other Ambulatory Visit: Payer: Self-pay | Admitting: Family Medicine

## 2022-02-21 ENCOUNTER — Telehealth: Payer: Self-pay | Admitting: Family Medicine

## 2022-02-21 DIAGNOSIS — R1013 Epigastric pain: Secondary | ICD-10-CM

## 2022-02-21 NOTE — Telephone Encounter (Signed)
Patient called and states that he is still have the same symptoms as before and states that Dr. Constance Haw told him to call us back if not better. (See LOV). I did explain to patient (per LOV) that we will refer him back to Dr. Abbey Chatters for further work up of his GI issues. Patient verbalized understanding. Referral placed.

## 2022-02-27 ENCOUNTER — Encounter: Payer: Self-pay | Admitting: Gastroenterology

## 2022-02-27 ENCOUNTER — Ambulatory Visit (INDEPENDENT_AMBULATORY_CARE_PROVIDER_SITE_OTHER): Payer: Self-pay | Admitting: Gastroenterology

## 2022-02-27 ENCOUNTER — Encounter: Payer: Self-pay | Admitting: *Deleted

## 2022-02-27 VITALS — BP 111/71 | HR 62 | Temp 97.8°F | Ht 69.0 in | Wt 196.0 lb

## 2022-02-27 DIAGNOSIS — K219 Gastro-esophageal reflux disease without esophagitis: Secondary | ICD-10-CM

## 2022-02-27 DIAGNOSIS — G8929 Other chronic pain: Secondary | ICD-10-CM

## 2022-02-27 DIAGNOSIS — R110 Nausea: Secondary | ICD-10-CM

## 2022-02-27 DIAGNOSIS — R1013 Epigastric pain: Secondary | ICD-10-CM

## 2022-02-27 MED ORDER — ONDANSETRON HCL 4 MG PO TABS: 4.0000 mg | ORAL_TABLET | Freq: Three times a day (TID) | ORAL | 1 refills | Status: DC | PRN

## 2022-02-27 MED ORDER — OMEPRAZOLE 40 MG PO CPDR: 40.0000 mg | DELAYED_RELEASE_CAPSULE | Freq: Two times a day (BID) | ORAL | 3 refills | Status: DC

## 2022-02-27 NOTE — Patient Instructions (Addendum)
English: Please stop taking sucralfate.  Stop pantoprazole.  Start omeprazole 40 mg twice daily, 30 minutes prior to breakfast and dinner.  You may take famotidine once daily, 30 minutes prior to lunch.  I am sending in Zofran for you to take 4 mg 3 times per day as needed for nausea.  We are scheduling you for an upper endoscopy in the near future with Dr. Marletta Lor.  Follow a GERD diet:  Avoid fried, fatty, greasy, spicy, citrus foods. Avoid caffeine and carbonated beverages. Avoid chocolate. Try eating 4-6 small meals a day rather than 3 large meals. Do not eat within 3 hours of laying down. Prop head of bed up on wood or bricks to create a 6 inch incline.  If you begin to have any constipation or go more than 2 days without a bowel movement, you may take MiraLAX as needed.  It is normal after having your gallbladder removed to have darker green stools or looser stools.  If you begin having more than 4-6 loose bowel movements per day, please contact the office.  It was a pleasure to see you today. I want to create trusting relationships with patients. If you receive a survey regarding your visit,  I greatly appreciate you taking time to fill this out on paper or through your MyChart. I value your feedback.  [name], MSN, FNP-BC, AGACNP-BC [facility]  Espanol: Por favor, deje de tomar sucralfato.  Deje de hacer pantoprazol.  Comience a tomar omeprazol 40 mg dos veces al da, 30 minutos antes del desayuno y Physicist, medical.  Puede tomar famotidina una vez al da, 30 minutos antes del almuerzo.  Le envo Zofran para que tome 4 mg 3 veces al da segn sea necesario para las nuseas.  Le estamos programando una endoscopia superior en un futuro cercano con el Dr. Marletta Lor.  Siga una dieta para la ERGE:  Evite los alimentos fritos, grasos, grasosos, picantes y ctricos. Evite la cafena y las bebidas carbonatadas. Evita el chocolate. Trate de comer de 4 a 6  comidas pequeas al Geophysical data processor de 3 comidas grandes. No coma dentro de las 3 horas posteriores a Teacher, music. Apoye la cabecera de la cama sobre [place] o ladrillos para crear una inclinacin de 6 pulgadas.   Si comienza a tener estreimiento o pasa ms de 2 das sin defecar, puede tomar MiraLAX segn sea necesario.  Es normal que despus de la extirpacin de la vescula biliar tenga heces de color verde ms oscuro o heces ms blandas.  Si comienza a tener ms de 4 a 6 deposiciones sueltas por da, comunquese con la oficina.   Ha sido un placer verte hoy. [name] crear relaciones de confianza con los Anta[place]. Si recibe [employer] su visita, le agradezco mucho que se tome el tiempo para completarla en papel o a travs de su MyChart. Valoro sus comentarios.  [name], MSN, FNP-BC, AGACNP-BC [name]

## 2022-02-27 NOTE — H&P (View-Only) (Signed)
 GI Office Note    Referring Provider: No ref. provider found Primary Care Physician:  Pcp, No Primary Gastroenterologist: Charles K. Carver, DO  Date:  02/27/2022  ID:  Keith Hale, DOB 07/21/1980, MRN [id number]   Chief Complaint   Chief Complaint  Patient presents with   Follow-up    Still has pain in stomach, burning and nausea. Had gallbladder removed a month ago.     History of Present Illness  Keith Hale is a 40 y.o. male with a history of biliary dyskinesia s/p cholecystectomy last month, H. pylori gastritis in 2020 without evidence of H. pylori on last EGD in August 2022, GERD, and constipation presenting today for follow up.   History of upper GI bleed in the setting of ibuprofen and ASA powder use with finding of duodenal ulcer with visible vessel and H. pylori gastritis on EGD in July 2020.  He was treated with clindamycin triple therapy.  He had follow-up stool testing in November 2020 that was negative.  He had worsening epigastric discomfort in August 2022 with EGD revealing gastritis and biopsy with mild inflammation and negative for H. pylori.  Was advised to continue PPI twice daily.  He had follow-up office visit in April 2023 with Dr. Carver where he represented with epigastric pain radiating to his right upper quadrant and left lower quadrant with associated nausea after meals.  On prior CT in June 2020 it was unremarkable.  He denied NSAID use at that time and he had right upper quadrant ultrasound that was negative.  As well as continue PPI twice daily and was given Carafate 4 times daily.  He had placed telephone call to the office reporting constipation abdominal pain was recommended to begin MiraLAX twice daily he was offered HIDA scan which he initially declined.   HIDA scan in May 2023 noting reduced gallbladder EF of 16%.   Last office visit 12/10/21 with ongoing nausea without vomiting.  Also with ongoing epigastric pain.  Main feel good for about 1-2 days  and then goes back to having pain, even with water.  He queried about stomach cancer.  Was taking pantoprazole twice daily and trying to stay away from fatty/spicy foods.  Does admit to drinking coffee every day.  Eats a lot of tomato based products.  Constipation improved, without needing MiraLAX.  Denied typical throat burning or swallowing issues.  Was also on Carafate 1 g 4 times daily.  Advised to wean Carafate, continue pantoprazole 40 mg twice daily and to consider general surgery referral for cholecystectomy.  GERD diet reinforced. F/u in 4 months.  Laparoscopic cholecystectomy 01/24/2022.  Per postop phone call 02/11/2022 patient reported he was still having a few symptoms the first few days after surgery and asked about EGD.  Reported he was tolerating diet, having good pain control, and regular bowel movements.   Today:  Symptoms  still there. Generalized abdominal pain. Nauseas everyday, all throughout. Still able to eat. No vomiting. Has the urge to vomit. Just has burning form mid chest and down. No more ibuprofen, BC or goody powders. Only takes tylenol sometimes. Still anxious about what is going on. Had a cousin that died of stomach cancer. Same weight today as from August.   Diet: trying to not eat fatty foods, staying away from spicy foods. Did have a tomato sauce and that bothers him. Still drinking coffee. Not really any chocolate. Eats a nromal mexican diet, tortillas, beans, meats, but trying to avoid fatty meats.   Not frying foods. Does eat fast food not very often.  Denies frequent regurgitation of food.   Does have some constipation. Was taking miralax but had recently stopped. Denies melena or BRBPR. Stools have been green in color since surgery.   Current Outpatient Medications  Medication Sig Dispense Refill   omeprazole (PRILOSEC) 40 MG capsule Take 1 capsule (40 mg total) by mouth in the morning and at bedtime. 90 capsule 3   ondansetron (ZOFRAN) 4 MG tablet Take 1 tablet  (4 mg total) by mouth every 8 (eight) hours as needed. 40 tablet 1   No current facility-administered medications for this visit.    Past Medical History:  Diagnosis Date   GERD (gastroesophageal reflux disease)    GI hemorrhage 11/01/2018   duodenal ulcer with visible vessel   Helicobacter pylori gastritis 10/2018   treated with amoxicillin, clarithromycin, pantoprazole.  Follow-up stool test negative in November 2020.    Past Surgical History:  Procedure Laterality Date   APPENDECTOMY     BIOPSY  11/01/2018   Procedure: BIOPSY;  Surgeon: West Bali, MD;  Location: [facility];  Service: Endoscopy;;   BIOPSY  12/07/2020   Procedure: BIOPSY;  Surgeon: Lanelle Bal, DO;  Location: [facility];  Service: Endoscopy;;   CHOLECYSTECTOMY N/A 01/24/2022   Procedure: LAPAROSCOPIC CHOLECYSTECTOMY;  Surgeon: Lucretia Roers, MD;  Location: [facility];  Service: General;  Laterality: N/A;   ESOPHAGOGASTRODUODENOSCOPY (EGD) WITH PROPOFOL N/A 11/01/2018   Surgeon: West Bali, MD;  duodenal ulcer with visible vessel and H. pylori gastritis   ESOPHAGOGASTRODUODENOSCOPY (EGD) WITH PROPOFOL N/A 12/07/2020   Surgeon: Lanelle Bal, DO; gastritis with biopsies revealing slight chronic inflammation and negative for H. pylori, otherwise normal exam.   TONSILLECTOMY      Family History  Problem Relation Age of Onset   Hypertension Mother    Colon cancer Neg Hx     Allergies as of 02/27/2022   (No Known Allergies)    Social History   Socioeconomic History   Marital status: Married    Spouse name: Not on file   Number of children: Not on file   Years of education: Not on file   Highest education level: Not on file  Occupational History   Not on file  Tobacco Use   Smoking status: Former    Packs/day: 0.00    Years: 5.00    Total pack years: 0.00    Types: Cigarettes    Quit date: 04/21/2017    Years since quitting: 4.8    Passive exposure: Past   Smokeless  tobacco: Never   Tobacco comments:    previously smoked about 2 cig/ month.  Vaping Use   Vaping Use: Never used  Substance and Sexual Activity   Alcohol use: Not Currently   Drug use: No   Sexual activity: Yes  Other Topics Concern   Not on file  Social History Narrative   Not on file   Social Determinants of Health   Financial Resource Strain: Not on file  Food Insecurity: Not on file  Transportation Needs: Not on file  Physical Activity: Not on file  Stress: Not on file  Social Connections: Not on file     Review of Systems   Gen: Denies fever, chills, anorexia. Denies fatigue, weakness, weight loss.  CV: Denies chest pain, palpitations, syncope, peripheral edema, and claudication. Resp: Denies dyspnea at rest, cough, wheezing, coughing up blood, and pleurisy. GI: See HPI Derm: Denies  rash, itching, dry skin Psych: Denies depression, anxiety, memory loss, confusion. No homicidal or suicidal ideation.  Heme: Denies bruising, bleeding, and enlarged lymph nodes.   Physical Exam   BP 111/71 (BP Location: Right Arm, Patient Position: Sitting, Cuff Size: Normal)   Pulse 62   Temp 97.8 F (36.6 C) (Temporal)   Ht 5' 9" (1.753 m)   Wt 196 lb (88.9 kg)   SpO2 96%   BMI 28.94 kg/m   General:   Alert and oriented. No distress noted. Pleasant and cooperative.  Head:  Normocephalic and atraumatic. Eyes:  Conjuctiva clear without scleral icterus. Mouth:  Oral mucosa pink and moist. Good dentition. No lesions. Lungs:  Clear to auscultation bilaterally. No wheezes, rales, or rhonchi. No distress.  Heart:  S1, S2 present without murmurs appreciated.  Abdomen:  +BS, soft, non-tender and non-distended. No rebound or guarding. No HSM or masses noted. Rectal: deferred Msk:  Symmetrical without gross deformities. Normal posture. Extremities:  Without edema. Neurologic:  Alert and  oriented x4 Psych:  Alert and cooperative. Normal mood and affect.   Assessment  Holton A  Cajuste is a 40 y.o. male with a history of biliary dyskinesia s/p cholecystectomy last month, H. pylori gastritis in 2020 without evidence of H. pylori on last EGD in August 2022, GERD, and constipation presenting today for follow up.   GERD, epigastric pain, Nausea: Normal lipase, no evidence of diabetes.  Pain described as occurring from mid chest to mid abdomen. Offered to obtain abdominal imaging given ongoing abdominal pain, however patient would like to proceed with a repeat upper endoscopy given his concern for any possible malignancy givne family history of gastric cancer. He does not have any alarm symptoms as weight is stable and he is able to eat without difficulty. Will change PPI from pantoprazole to omeprazole 40 mg BID and add famotidine 20 mg once daily as needed. Will stop carafate.  Suspect ongoing dyspepsia without any clear etiology at this time.  Denies any dysphagia.  HIDA scan revealed biliary dyskinesia and underwent cholecystectomy in October with some improvement of symptoms for a few days, then symptoms returned. Query ongoing gastritis versus reflux as cause for ongoing nausea and epigastric pain.  If changing PPI not helpful, and unremarkable upper endoscopy, may consider low-dose TCA for dyspepsia versus referral for pH impedance testing while on PPI.  Provided with Zofran 4 mg 3 times daily as needed for nausea.  Intermittent constipation: Intermittent constipation.  Previously was taking MiraLAX as needed but recently stopped.  Denies melena or BRBPR.  Reports stools have been soft and usually dark green in nature postcholecystectomy.  We discussed the normal stool consistency and colors and postcholecystectomy state.  Advised that he may resume MiraLAX as needed if he goes more than 2 days without a bowel movement.   PLAN   Stop pantoprazole, start omeprazole 40 mg BID.  Zofran 4 mg tid as needed.  GERD diet/lifestyle modifications Famotidine 20 mg once daily.  Stop  carafate  Proceed with upper endoscopy with propofol by Dr. Carver in near future: the risks, benefits, and alternatives have been discussed with the patient in detail. The patient states understanding and desires to proceed. ASA 2 pH impedence testing on PPI if not improved after changing medications.  Miralax if more than 2 days without a BM.  Follow up 2 months post procedure.     Kayzlee Wirtanen, MSN, FNP-BC, AGACNP-BC [facility] 

## 2022-02-27 NOTE — Progress Notes (Signed)
GI Office Note    Referring Provider: No ref. provider found Primary Care Physician:  Pcp, No Primary Gastroenterologist: Hennie Duos. Marletta Lor, DO  Date:  02/27/2022  ID:  Keith Hale, DOB Nov 26, 1980, MRN [id number]   Chief Complaint   Chief Complaint  Patient presents with   Follow-up    Still has pain in stomach, burning and nausea. Had gallbladder removed a month ago.     History of Present Illness  Keith Hale is a 41 y.o. male with a history of biliary dyskinesia s/p cholecystectomy last month, H. pylori gastritis in 2020 without evidence of H. pylori on last EGD in August 2022, GERD, and constipation presenting today for follow up.   History of upper GI bleed in the setting of ibuprofen and ASA powder use with finding of duodenal ulcer with visible vessel and H. pylori gastritis on EGD in July 2020.  He was treated with clindamycin triple therapy.  He had follow-up stool testing in November 2020 that was negative.  He had worsening epigastric discomfort in August 2022 with EGD revealing gastritis and biopsy with mild inflammation and negative for H. pylori.  Was advised to continue PPI twice daily.  He had follow-up office visit in April 2023 with Dr. Marletta Lor where he represented with epigastric pain radiating to his right upper quadrant and left lower quadrant with associated nausea after meals.  On prior CT in June 2020 it was unremarkable.  He denied NSAID use at that time and he had right upper quadrant ultrasound that was negative.  As well as continue PPI twice daily and was given Carafate 4 times daily.  He had placed telephone call to the office reporting constipation abdominal pain was recommended to begin MiraLAX twice daily he was offered HIDA scan which he initially declined.   HIDA scan in May 2023 noting reduced gallbladder EF of 16%.   Last office visit 12/10/21 with ongoing nausea without vomiting.  Also with ongoing epigastric pain.  Main feel good for about 1-2 days  and then goes back to having pain, even with water.  He queried about stomach cancer.  Was taking pantoprazole twice daily and trying to stay away from fatty/spicy foods.  Does admit to drinking coffee every day.  Eats a lot of tomato based products.  Constipation improved, without needing MiraLAX.  Denied typical throat burning or swallowing issues.  Was also on Carafate 1 g 4 times daily.  Advised to wean Carafate, continue pantoprazole 40 mg twice daily and to consider general surgery referral for cholecystectomy.  GERD diet reinforced. F/u in 4 months.  Laparoscopic cholecystectomy 01/24/2022.  Per postop phone call 02/11/2022 patient reported he was still having a few symptoms the first few days after surgery and asked about EGD.  Reported he was tolerating diet, having good pain control, and regular bowel movements.   Today:  Symptoms  still there. Generalized abdominal pain. Nauseas everyday, all throughout. Still able to eat. No vomiting. Has the urge to vomit. Just has burning form mid chest and down. No more ibuprofen, BC or goody powders. Only takes tylenol sometimes. Still anxious about what is going on. Had a cousin that died of stomach cancer. Same weight today as from August.   Diet: trying to not eat fatty foods, staying away from spicy foods. Did have a tomato sauce and that bothers him. Still drinking coffee. Not really any chocolate. Eats a nromal [place] diet, tortillas, beans, meats, but trying to avoid fatty meats.  Not frying foods. Does eat fast food not very often.  Denies frequent regurgitation of food.   Does have some constipation. Was taking miralax but had recently stopped. Denies melena or BRBPR. Stools have been green in color since surgery.   Current Outpatient Medications  Medication Sig Dispense Refill   omeprazole (PRILOSEC) 40 MG capsule Take 1 capsule (40 mg total) by mouth in the morning and at bedtime. 90 capsule 3   ondansetron (ZOFRAN) 4 MG tablet Take 1 tablet  (4 mg total) by mouth every 8 (eight) hours as needed. 40 tablet 1   No current facility-administered medications for this visit.    Past Medical History:  Diagnosis Date   GERD (gastroesophageal reflux disease)    GI hemorrhage 11/01/2018   duodenal ulcer with visible vessel   Helicobacter pylori gastritis 10/2018   treated with amoxicillin, clarithromycin, pantoprazole.  Follow-up stool test negative in November 2020.    Past Surgical History:  Procedure Laterality Date   APPENDECTOMY     BIOPSY  11/01/2018   Procedure: BIOPSY;  Surgeon: West Bali, MD;  Location: [facility];  Service: Endoscopy;;   BIOPSY  12/07/2020   Procedure: BIOPSY;  Surgeon: Lanelle Bal, DO;  Location: [facility];  Service: Endoscopy;;   CHOLECYSTECTOMY N/A 01/24/2022   Procedure: LAPAROSCOPIC CHOLECYSTECTOMY;  Surgeon: Lucretia Roers, MD;  Location: [facility];  Service: General;  Laterality: N/A;   ESOPHAGOGASTRODUODENOSCOPY (EGD) WITH PROPOFOL N/A 11/01/2018   Surgeon: West Bali, MD;  duodenal ulcer with visible vessel and H. pylori gastritis   ESOPHAGOGASTRODUODENOSCOPY (EGD) WITH PROPOFOL N/A 12/07/2020   Surgeon: Lanelle Bal, DO; gastritis with biopsies revealing slight chronic inflammation and negative for H. pylori, otherwise normal exam.   TONSILLECTOMY      Family History  Problem Relation Age of Onset   Hypertension Mother    Colon cancer Neg Hx     Allergies as of 02/27/2022   (No Known Allergies)    Social History   Socioeconomic History   Marital status: Married    Spouse name: Not on file   Number of children: Not on file   Years of education: Not on file   Highest education level: Not on file  Occupational History   Not on file  Tobacco Use   Smoking status: Former    Packs/day: 0.00    Years: 5.00    Total pack years: 0.00    Types: Cigarettes    Quit date: 04/21/2017    Years since quitting: 4.8    Passive exposure: Past   Smokeless  tobacco: Never   Tobacco comments:    previously smoked about 2 cig/ month.  Vaping Use   Vaping Use: Never used  Substance and Sexual Activity   Alcohol use: Not Currently   Drug use: No   Sexual activity: Yes  Other Topics Concern   Not on file  Social History Narrative   Not on file   Social Determinants of Health   Financial Resource Strain: Not on file  Food Insecurity: Not on file  Transportation Needs: Not on file  Physical Activity: Not on file  Stress: Not on file  Social Connections: Not on file     Review of Systems   Gen: Denies fever, chills, anorexia. Denies fatigue, weakness, weight loss.  CV: Denies chest pain, palpitations, syncope, peripheral edema, and claudication. Resp: Denies dyspnea at rest, cough, wheezing, coughing up blood, and pleurisy. GI: See HPI Derm: Denies  rash, itching, dry skin Psych: Denies depression, anxiety, memory loss, confusion. No homicidal or suicidal ideation.  Heme: Denies bruising, bleeding, and enlarged lymph nodes.   Physical Exam   BP 111/71 (BP Location: Right Arm, Patient Position: Sitting, Cuff Size: Normal)   Pulse 62   Temp 97.8 F (36.6 C) (Temporal)   Ht 5\' 9"  (1.753 m)   Wt 196 lb (88.9 kg)   SpO2 96%   BMI 28.94 kg/m   General:   Alert and oriented. No distress noted. Pleasant and cooperative.  Head:  Normocephalic and atraumatic. Eyes:  Conjuctiva clear without scleral icterus. Mouth:  Oral mucosa Hale and moist. Good dentition. No lesions. Lungs:  Clear to auscultation bilaterally. No wheezes, rales, or rhonchi. No distress.  Heart:  S1, S2 present without murmurs appreciated.  Abdomen:  +BS, soft, non-tender and non-distended. No rebound or guarding. No HSM or masses noted. Rectal: deferred Msk:  Symmetrical without gross deformities. Normal posture. Extremities:  Without edema. Neurologic:  Alert and  oriented x4 Psych:  Alert and cooperative. Normal mood and affect.   Assessment  Keith Hale is a 41 y.o. male with a history of biliary dyskinesia s/p cholecystectomy last month, H. pylori gastritis in 2020 without evidence of H. pylori on last EGD in August 2022, GERD, and constipation presenting today for follow up.   GERD, epigastric pain, Nausea: Normal lipase, no evidence of diabetes.  Pain described as occurring from mid chest to mid abdomen. Offered to obtain abdominal imaging given ongoing abdominal pain, however patient would like to proceed with a repeat upper endoscopy given his concern for any possible malignancy givne family history of gastric cancer. He does not have any alarm symptoms as weight is stable and he is able to eat without difficulty. Will change PPI from pantoprazole to omeprazole 40 mg BID and add famotidine 20 mg once daily as needed. Will stop carafate.  Suspect ongoing dyspepsia without any clear etiology at this time.  Denies any dysphagia.  HIDA scan revealed biliary dyskinesia and underwent cholecystectomy in October with some improvement of symptoms for a few days, then symptoms returned. Query ongoing gastritis versus reflux as cause for ongoing nausea and epigastric pain.  If changing PPI not helpful, and unremarkable upper endoscopy, may consider low-dose TCA for dyspepsia versus referral for pH impedance testing while on PPI.  Provided with Zofran 4 mg 3 times daily as needed for nausea.  Intermittent constipation: Intermittent constipation.  Previously was taking MiraLAX as needed but recently stopped.  Denies melena or BRBPR.  Reports stools have been soft and usually dark green in nature postcholecystectomy.  We discussed the normal stool consistency and colors and postcholecystectomy state.  Advised that he may resume MiraLAX as needed if he goes more than 2 days without a bowel movement.   PLAN   Stop pantoprazole, start omeprazole 40 mg BID.  Zofran 4 mg tid as needed.  GERD diet/lifestyle modifications Famotidine 20 mg once daily.  Stop  carafate  Proceed with upper endoscopy with propofol by Dr. November in near future: the risks, benefits, and alternatives have been discussed with the patient in detail. The patient states understanding and desires to proceed. ASA 2 pH impedence testing on PPI if not improved after changing medications.  Miralax if more than 2 days without a BM.  Follow up 2 months post procedure.     Marletta Lor, MSN, FNP-BC, AGACNP-BC [facility]

## 2022-03-18 ENCOUNTER — Other Ambulatory Visit: Payer: Self-pay

## 2022-03-18 ENCOUNTER — Encounter (HOSPITAL_COMMUNITY)
Admission: RE | Disposition: A | Discharge status: Home / Self Care | Payer: Self-pay | Source: Ambulatory Visit | Attending: Internal Medicine

## 2022-03-18 ENCOUNTER — Ambulatory Visit (HOSPITAL_COMMUNITY)
Admission: RE | Admit: 2022-03-18 | Discharge: 2022-03-18 | Disposition: A | Discharge status: Home / Self Care | Payer: Self-pay | Source: Ambulatory Visit | Attending: Internal Medicine | Admitting: Internal Medicine

## 2022-03-18 ENCOUNTER — Ambulatory Visit (HOSPITAL_COMMUNITY): Payer: Self-pay | Admitting: Anesthesiology

## 2022-03-18 ENCOUNTER — Encounter (HOSPITAL_COMMUNITY): Payer: Self-pay

## 2022-03-18 ENCOUNTER — Ambulatory Visit (HOSPITAL_BASED_OUTPATIENT_CLINIC_OR_DEPARTMENT_OTHER): Payer: Self-pay | Admitting: Anesthesiology

## 2022-03-18 DIAGNOSIS — R6881 Early satiety: Secondary | ICD-10-CM

## 2022-03-18 DIAGNOSIS — K30 Functional dyspepsia: Secondary | ICD-10-CM

## 2022-03-18 DIAGNOSIS — K219 Gastro-esophageal reflux disease without esophagitis: Secondary | ICD-10-CM | POA: Insufficient documentation

## 2022-03-18 DIAGNOSIS — Z8619 Personal history of other infectious and parasitic diseases: Secondary | ICD-10-CM | POA: Insufficient documentation

## 2022-03-18 DIAGNOSIS — K5900 Constipation, unspecified: Secondary | ICD-10-CM | POA: Insufficient documentation

## 2022-03-18 DIAGNOSIS — R110 Nausea: Secondary | ICD-10-CM | POA: Insufficient documentation

## 2022-03-18 DIAGNOSIS — K3189 Other diseases of stomach and duodenum: Secondary | ICD-10-CM | POA: Insufficient documentation

## 2022-03-18 DIAGNOSIS — K2970 Gastritis, unspecified, without bleeding: Secondary | ICD-10-CM

## 2022-03-18 DIAGNOSIS — Z800 Family history of malignant neoplasm of digestive organs: Secondary | ICD-10-CM | POA: Insufficient documentation

## 2022-03-18 DIAGNOSIS — Z9049 Acquired absence of other specified parts of digestive tract: Secondary | ICD-10-CM | POA: Insufficient documentation

## 2022-03-18 DIAGNOSIS — Z8711 Personal history of peptic ulcer disease: Secondary | ICD-10-CM | POA: Insufficient documentation

## 2022-03-18 DIAGNOSIS — Z87891 Personal history of nicotine dependence: Secondary | ICD-10-CM | POA: Insufficient documentation

## 2022-03-18 DIAGNOSIS — R1013 Epigastric pain: Secondary | ICD-10-CM

## 2022-03-18 HISTORY — PX: BIOPSY: SHX5522

## 2022-03-18 HISTORY — PX: ESOPHAGOGASTRODUODENOSCOPY (EGD) WITH PROPOFOL: SHX5813

## 2022-03-18 SURGERY — ESOPHAGOGASTRODUODENOSCOPY (EGD) WITH PROPOFOL
Anesthesia: General

## 2022-03-18 MED ORDER — LACTATED RINGERS IV SOLN: INTRAVENOUS | Status: DC

## 2022-03-18 MED ORDER — PROPOFOL 10 MG/ML IV BOLUS
INTRAVENOUS | Status: DC | PRN
  Administered 2022-03-18: 50 mg via INTRAVENOUS
  Administered 2022-03-18: 20 mg via INTRAVENOUS
  Administered 2022-03-18: 30 mg via INTRAVENOUS
  Administered 2022-03-18: 40 mg via INTRAVENOUS
  Administered 2022-03-18: 100 mg via INTRAVENOUS

## 2022-03-18 MED ORDER — PHENYLEPHRINE 80 MCG/ML (10ML) SYRINGE FOR IV PUSH (FOR BLOOD PRESSURE SUPPORT)
PREFILLED_SYRINGE | INTRAVENOUS | Status: DC | PRN
  Administered 2022-03-18 (×2): 160 ug via INTRAVENOUS

## 2022-03-18 MED ORDER — LIDOCAINE HCL (CARDIAC) PF 100 MG/5ML IV SOSY
PREFILLED_SYRINGE | INTRAVENOUS | Status: DC | PRN
  Administered 2022-03-18: 50 mg via INTRATRACHEAL

## 2022-03-18 NOTE — Op Note (Signed)
[facility] Patient Name: Keith Hale Procedure Date: 03/18/2022 8:05 AM MRN: [id number] Date of Birth: [birth year] Attending MD: Elon Alas. Abbey Chatters , [place], [id number] CSN: [id number] Age: 41 Admit Type: Outpatient Procedure:                Upper GI endoscopy Indications:              Epigastric abdominal pain, Functional Dyspepsia,                            Heartburn Providers:                Elon Alas. Abbey Chatters, DO, Caprice Kluver, Raphael Gibney,                            Technician Referring MD:              Medicines:                See the Anesthesia note for documentation of the                            administered medications Complications:            No immediate complications. Estimated Blood Loss:     Estimated blood loss was minimal. Procedure:                Pre-Anesthesia Assessment:                           - The anesthesia plan was to use monitored                            anesthesia care (MAC).                           After obtaining informed consent, the endoscope was                            passed under direct vision. Throughout the                            procedure, the patient's blood pressure, pulse, and                            oxygen saturations were monitored continuously. The                            [id number]-H190 ([id number]) scope was introduced through the                            mouth, and advanced to the second part of duodenum.                            The upper GI endoscopy was accomplished without                            difficulty. The patient tolerated the procedure  well. Scope In: 8:23:30 AM Scope Out: 8:32:28 AM Total Procedure Duration: 0 hours 8 minutes 58 seconds  Findings:      There is no endoscopic evidence of bleeding, areas of erosion or       esophagitis in the entire esophagus.      Localized mild inflammation characterized by erythema was found in the       gastric antrum. Biopsies were  taken with a cold forceps for Helicobacter       pylori testing.      Mucosal changes characterized by prominence, ?polyp were found in the       second portion of the duodenum. Biopsies were taken with a cold forceps       for histology. Appears separate from ampulla (see picture) Impression:               - Gastritis. Biopsied.                           - Mucosal changes in the duodenum. Biopsied. Moderate Sedation:      Per Anesthesia Care Recommendation:           - Patient has a contact number available for                            emergencies. The signs and symptoms of potential                            delayed complications were discussed with the                            patient. Return to normal activities tomorrow.                            Written discharge instructions were provided to the                            patient.                           - Resume previous diet.                           - Continue present medications.                           - Await pathology results.                           - Use Prilosec (omeprazole) 40 mg PO BID.                           - Return to GI clinic in 2 months. Procedure Code(s):        --- Professional ---                           [phone], Esophagogastroduodenoscopy, flexible,  transoral; with biopsy, single or multiple Diagnosis Code(s):        --- Professional ---                           K29.70, Gastritis, unspecified, without bleeding                           K31.89, Other diseases of stomach and duodenum                           R10.13, Epigastric pain                           K30, Functional dyspepsia                           R12, Heartburn CPT copyright 2022 American Medical Association. All rights reserved. The codes documented in this report are preliminary and upon coder review may  be revised to meet current compliance requirements. Elon Alas. Abbey Chatters, DO Lenawee Raksha Wolfgang,  DO 03/18/2022 8:40:00 AM This report has been signed electronically. Number of Addenda: 0

## 2022-03-18 NOTE — Discharge Instructions (Addendum)
EGD Discharge instructions Please read the instructions outlined below and refer to this sheet in the next few weeks. These discharge instructions provide you with general information on caring for yourself after you leave the hospital. Your doctor may also give you specific instructions. While your treatment has been planned according to the most current medical practices available, unavoidable complications occasionally occur. If you have any problems or questions after discharge, please call your doctor. ACTIVITY You may resume your regular activity but move at a slower pace for the next 24 hours.  Take frequent rest periods for the next 24 hours.  Walking will help expel (get rid of) the air and reduce the bloated feeling in your abdomen.  No driving for 24 hours (because of the anesthesia (medicine) used during the test).  You may shower.  Do not sign any important legal documents or operate any machinery for 24 hours (because of the anesthesia used during the test).  NUTRITION Drink plenty of fluids.  You may resume your normal diet.  Begin with a light meal and progress to your normal diet.  Avoid alcoholic beverages for 24 hours or as instructed by your caregiver.  MEDICATIONS You may resume your normal medications unless your caregiver tells you otherwise.  WHAT YOU CAN EXPECT TODAY You may experience abdominal discomfort such as a feeling of fullness or "gas" pains.  FOLLOW-UP Your doctor will discuss the results of your test with you.  SEEK IMMEDIATE MEDICAL ATTENTION IF ANY OF THE FOLLOWING OCCUR: Excessive nausea (feeling sick to your stomach) and/or vomiting.  Severe abdominal pain and distention (swelling).  Trouble swallowing.  Temperature over 101 F (37.8 C).  Rectal bleeding or vomiting of blood.   Your EGD revealed mild amount inflammation in your stomach.  I took biopsies of this to rule out infection with a bacteria called H. pylori.  Await pathology results, my  office will contact you.  Your esophagus appeared normal.  There was a mild prominence in your small bowel which I did take biopsies.  This could be due to inflammation versus a polyp.  We will call with these results as well.  Continue on omeprazole twice daily.  Follow-up with GI clinic in 6 to 8 weeks.    MESSAGE SENT TO OFFICE TO SCHEDULE APPOINTMENT    I hope you have a great rest of your week!  Hennie Duos. Marletta Lor, D.O. Gastroenterology and Hepatology [facility]

## 2022-03-18 NOTE — Anesthesia Preprocedure Evaluation (Addendum)
Anesthesia Evaluation  Patient identified by MRN, date of birth, ID band Patient awake    Reviewed: Allergy & Precautions, NPO status , Patient's Chart, lab work & pertinent test results  Airway Mallampati: III  TM Distance: >3 FB Neck ROM: Full    Dental  (+) Dental Advisory Given, Teeth Intact   Pulmonary former smoker   Pulmonary exam normal breath sounds clear to auscultation       Cardiovascular negative cardio ROS Normal cardiovascular exam Rhythm:Regular Rate:Normal     Neuro/Psych negative neurological ROS  negative psych ROS   GI/Hepatic Neg liver ROS, PUD,GERD  Medicated and Controlled,,  Endo/Other  negative endocrine ROS    Renal/GU negative Renal ROS  negative genitourinary   Musculoskeletal negative musculoskeletal ROS (+)    Abdominal   Peds negative pediatric ROS (+)  Hematology  (+) Blood dyscrasia, anemia   Anesthesia Other Findings   Reproductive/Obstetrics negative OB ROS                             Anesthesia Physical Anesthesia Plan  ASA: 2  Anesthesia Plan: General   Post-op Pain Management: Minimal or no pain anticipated   Induction: Intravenous  PONV Risk Score and Plan: 0 and Propofol infusion  Airway Management Planned: Nasal Cannula and Natural Airway  Additional Equipment:   Intra-op Plan:   Post-operative Plan:   Informed Consent: I have reviewed the patients History and Physical, chart, labs and discussed the procedure including the risks, benefits and alternatives for the proposed anesthesia with the patient or authorized representative who has indicated his/her understanding and acceptance.     Dental advisory given  Plan Discussed with: CRNA and Surgeon  Anesthesia Plan Comments:         Anesthesia Quick Evaluation

## 2022-03-18 NOTE — Anesthesia Postprocedure Evaluation (Signed)
Anesthesia Post Note  Patient: Keith Hale  Procedure(s) Performed: ESOPHAGOGASTRODUODENOSCOPY (EGD) WITH PROPOFOL BIOPSY  Patient location during evaluation: Phase II Anesthesia Type: General Level of consciousness: awake and alert and oriented Pain management: pain level controlled Vital Signs Assessment: post-procedure vital signs reviewed and stable Respiratory status: spontaneous breathing, nonlabored ventilation and respiratory function stable Cardiovascular status: blood pressure returned to baseline and stable Postop Assessment: no apparent nausea or vomiting Anesthetic complications: no  No notable events documented.   Last Vitals:  Vitals:   03/18/22 0835 03/18/22 0838  BP: (!) 89/51 102/69  Pulse: 71 76  Resp: (!) 25 20  Temp: (!) 36.4 C   SpO2: 97% 96%    Last Pain:  Vitals:   03/18/22 0838  TempSrc:   PainSc: 0-No pain                 Zaelyn Barbary C Oktober Glazer

## 2022-03-18 NOTE — Transfer of Care (Signed)
Immediate Anesthesia Transfer of Care Note  Patient: Keith Hale  Procedure(s) Performed: ESOPHAGOGASTRODUODENOSCOPY (EGD) WITH PROPOFOL BIOPSY  Patient Location: Endoscopy Unit  Anesthesia Type:General  Level of Consciousness: awake, alert , oriented, and patient cooperative  Airway & Oxygen Therapy: Patient Spontanous Breathing  Post-op Assessment: Report given to RN, Post -op Vital signs reviewed and stable, and Patient moving all extremities  Post vital signs: Reviewed and stable  Last Vitals:  Vitals Value Taken Time  BP    Temp    Pulse    Resp    SpO2      Last Pain:  Vitals:   03/18/22 0818  TempSrc:   PainSc: 0-No pain      Patients Stated Pain Goal: 7 (03/18/22 0716)  Complications: No notable events documented.

## 2022-03-18 NOTE — Interval H&P Note (Signed)
History and Physical Interval Note:  03/18/2022 8:15 AM  Keith Hale  has presented today for surgery, with the diagnosis of GERD, dyspepsia, nausea,epigastric pain.  The various methods of treatment have been discussed with the patient and family. After consideration of risks, benefits and other options for treatment, the patient has consented to  Procedure(s) with comments: ESOPHAGOGASTRODUODENOSCOPY (EGD) WITH PROPOFOL (N/A) - 1:00 pm, pt knows to arrive at 7:00 as a surgical intervention.  The patient's history has been reviewed, patient examined, no change in status, stable for surgery.  I have reviewed the patient's chart and labs.  Questions were answered to the patient's satisfaction.     Lanelle Bal

## 2022-03-19 LAB — SURGICAL PATHOLOGY

## 2022-03-21 ENCOUNTER — Encounter (HOSPITAL_COMMUNITY): Payer: Self-pay | Admitting: Internal Medicine

## 2022-03-21 NOTE — Progress Notes (Signed)
error 

## 2022-03-24 ENCOUNTER — Ambulatory Visit: Payer: Self-pay | Admitting: Gastroenterology

## 2022-03-26 ENCOUNTER — Ambulatory Visit: Payer: Self-pay | Admitting: Gastroenterology

## 2022-04-22 NOTE — Progress Notes (Signed)
GI Office Note    Referring Provider: No ref. provider found Primary Care Physician:  Pcp, No Primary Gastroenterologist: Elon Alas. Abbey Chatters, DO  Date:  04/25/2022  ID:  Keith Hale, DOB 30-Aug-1980, MRN [id number]   Chief Complaint   Chief Complaint  Patient presents with   Abdominal Pain    Upper stomach pains, sweating hands and dry mouth. Sometimes diarrhea and sometimes constipation. Both arms hurt.     History of Present Illness  Keith Hale is a 42 y.o. male with a history of biliary dyskinesia s/p cholecystectomy in October 2023, H. pylori gastritis in 2020 s/p treatment and repeat endoscopic evaluation without H. pylori, GERD, constipation, chronic nausea presenting today for postprocedure follow-up and ongoing GI complaints.  Patient has history of upper GI bleed in the setting of NSAID and ASA use with EGD in July 2020 findings duodenal ulcer with visible vessel and H. pylori gastritis.  Treated with clindamycin triple therapy with follow-up stool testing in November 2020 documenting eradication.  Began to have worsening epigastric discomfort in August 2022 with EGD revealing gastritis with biopsy with mild inflammation and no H. pylori.  CT in June 2020 negative and right upper quadrant ultrasound last year negative.  In April 2023 he was treated with PPI and Carafate 4 times daily.  HIDA scan offered which patient initially declined.  HIDA scan in May 2023 with reduced gallbladder EF of 16%.   Laparoscopic cholecystectomy 01/24/2022.  Noted to initially have improvement of nausea and abdominal pain however symptoms returned a few days after.  Last office visit 02/27/2022 patient reported ongoing generalized abdominal pain and nausea that occurs every day.  Still able to eat without issue.  Reported burning from mid chest down to the stomach.  Denied NSAID use.  Continue to report history of stomach cancer in his cousin and was concerned about gastric cancer.  Reported he  was not eating fatty foods, staying away from spicy foods.  Still drinking coffee every day.  Eating a typical Hispanic diet.  Denied constipation, diarrhea, melena, or BRBPR.  Advised to stop pantoprazole and start omeprazole 40 mg twice daily.  Given Zofran to use 4 mg 3 times daily as needed.  Also advised famotidine 20 mg once daily and to stop Carafate.  Per patient's request repeat EGD was scheduled.  Advised to complete pH impedance testing off PPI if not improved after switching from pantoprazole to omeprazole vs low dose TCA.   EGD 03/18/22: -Gastritis s/p biopsy -Mucosal changes in the duodenum s/p biopsy -Advised omeprazole 40 mg twice daily -Gastric biopsy with features of reactive gastropathy, negative H. Pylori -Duodenal biopsy with polypoid fragment of benign small bowel mucosa with reactive changes (negative for dysplasia or malignancy)   Today:  Nausea: Almost every day. No vomiting. Still able to eat. Taking it only as needed.   Abdominal pain, GERD: Ran out of the omeprazole he states but was taking it. Having daily abdominal pain and is also having pain in his arms. Has burning in the epigastric region.   Bowel habits: Normal sometimes but does have more nausea and abdominal pain when constipated. Usually goes once per day. Sometimes stools are hard. Does have to strain. Taking miralax every 3rd day.   Does have a lot of stress right now.    Current Outpatient Medications  Medication Sig Dispense Refill   amitriptyline (ELAVIL) 10 MG tablet Take 1 tablet (10 mg total) by mouth at bedtime. 30 tablet 1  Omega-3 Fatty Acids (FISH OIL OMEGA-3) 1000 MG CAPS Take by mouth.     ondansetron (ZOFRAN) 4 MG tablet Take 1 tablet (4 mg total) by mouth every 8 (eight) hours as needed. 15 tablet 1   No current facility-administered medications for this visit.    Past Medical History:  Diagnosis Date   GERD (gastroesophageal reflux disease)    GI hemorrhage 11/01/2018   duodenal  ulcer with visible vessel   Helicobacter pylori gastritis 10/2018   treated with amoxicillin, clarithromycin, pantoprazole.  Follow-up stool test negative in November 2020.    Past Surgical History:  Procedure Laterality Date   APPENDECTOMY     BIOPSY  11/01/2018   Procedure: BIOPSY;  Surgeon: Danie Binder, MD;  [facility];  Service: Endoscopy;;   BIOPSY  12/07/2020   Procedure: BIOPSY;  Surgeon: Eloise Harman, DO;  [facility];  Service: Endoscopy;;   BIOPSY  03/18/2022   Procedure: BIOPSY;  Surgeon: Eloise Harman, DO;  [facility];  Service: Endoscopy;;   CHOLECYSTECTOMY N/A 01/24/2022   Procedure: LAPAROSCOPIC CHOLECYSTECTOMY;  Surgeon: Virl Cagey, MD;  [facility]: [facility];  Service: General;  Laterality: N/A;   ESOPHAGOGASTRODUODENOSCOPY (EGD) WITH PROPOFOL N/A 11/01/2018   Surgeon: Danie Binder, MD;  duodenal ulcer with visible vessel and H. pylori gastritis   ESOPHAGOGASTRODUODENOSCOPY (EGD) WITH PROPOFOL N/A 12/07/2020   Surgeon: Eloise Harman, DO; gastritis with biopsies revealing slight chronic inflammation and negative for H. pylori, otherwise normal exam.   ESOPHAGOGASTRODUODENOSCOPY (EGD) WITH PROPOFOL N/A 03/18/2022   Procedure: ESOPHAGOGASTRODUODENOSCOPY (EGD) WITH PROPOFOL;  Surgeon: Eloise Harman, DO;  [facility];  Service: Endoscopy;  Laterality: N/A;  1:00 pm, pt knows to arrive at 7:00   TONSILLECTOMY      Family History  Problem Relation Age of Onset   Hypertension Mother    Colon cancer Neg Hx     Allergies as of 04/24/2022   (No Known Allergies)    Social History   Socioeconomic History   Marital status: Married    Spouse name: Not on file   Number of children: Not on file   Years of education: Not on file   Highest education level: Not on file  Occupational History   Not on file  Tobacco Use   Smoking status: Former    Packs/day: 0.00    Years: 5.00    Total pack years:  0.00    Types: Cigarettes    Quit date: 04/21/2017    Years since quitting: 5.0    Passive exposure: Past   Smokeless tobacco: Never   Tobacco comments:    previously smoked about 2 cig/ month.  Vaping Use   Vaping Use: Never used  Substance and Sexual Activity   Alcohol use: Not Currently   Drug use: No   Sexual activity: Yes  Other Topics Concern   Not on file  Social History Narrative   Not on file   Social Determinants of Health   Financial Resource Strain: Not on file  Food Insecurity: Not on file  Transportation Needs: Not on file  Physical Activity: Not on file  Stress: Not on file  Social Connections: Not on file     Review of Systems   Gen: Denies fever, chills, anorexia. Denies fatigue, weakness, weight loss.  CV: Denies chest pain, palpitations, syncope, peripheral edema, and claudication. Resp: Denies dyspnea at rest, cough, wheezing, coughing up blood, and pleurisy. GI: See  HPI Derm: Denies rash, itching, dry skin Psych: Denies depression, anxiety, memory loss, confusion. No homicidal or suicidal ideation.  Heme: Denies bruising, bleeding, and enlarged lymph nodes.   Physical Exam   BP 119/76 (BP Location: Right Arm, Patient Position: Sitting, Cuff Size: Large)   Pulse 75   Temp 98.1 F (36.7 C) (Temporal)   Ht 5\' 7"  (1.702 m)   Wt 195 lb (88.5 kg)   SpO2 98%   BMI 30.54 kg/m   General:   Alert and oriented. No distress noted. Pleasant and cooperative.  Head:  Normocephalic and atraumatic. Eyes:  Conjuctiva clear without scleral icterus. Mouth:  Oral mucosa pink and moist. Good dentition. No lesions. Lungs:  Clear to auscultation bilaterally. No wheezes, rales, or rhonchi. No distress.  Heart:  S1, S2 present without murmurs appreciated.  Abdomen:  +BS, soft, non-distended.  Mild TTP to epigastric region. No rebound or guarding. No HSM or masses noted. Rectal: deferred Msk:  Symmetrical without gross deformities. Normal posture. Extremities:   Without edema. Neurologic:  Alert and  oriented x4 Psych:  Alert and cooperative. Normal mood and affect.   Assessment  Keith Hale is a 42 y.o. male with a history of biliary dyskinesia s/p cholecystectomy in October 2023, H. pylori gastritis in 2020 s/p treatment and repeat endoscopic evaluation without H. pylori, GERD, constipation, chronic nausea presenting today for post procedure follow-up and ongoing GI complaints.  GERD, abdominal pain, Nausea: Chronic epigastric pain with history of H. pylori in 2020.  History of gastric cancer in his family.  He underwent EGD in August 2022 with gastritis, negative for H. pylori.  Has also had right upper quadrant pain and left lower quadrant pain associated with nausea postprandially.  Previous workup with negative CT and right upper quadrant ultrasound.  Had HIDA scan in May 2023 noting reduced gallbladder EF and initially declined cholecystectomy however he ultimately underwent removal in October 2023.  He continued to have symptoms on pantoprazole 40 mg twice daily therefore he was switched to omeprazole 40 mg twice daily as well as famotidine daily.  At last follow-up in November 2023 he requested repeat EGD due to concern of possible malignancy which again revealed gastritis and benign duodenal biopsies.  He continues to complain of symptoms even on omeprazole 40 mg twice daily and Pepcid.  Still able to eat and maintain weight.  Does admit to following a typical Hispanic diet and continues to have coffee daily.  Take Zofran only as needed.  He does report a lot of stress in his life right now.  Suspect psychosocial factors contributing to ongoing symptoms. Will trial low-dose amitriptyline 10 mg nightly given likely dyspepsia and GERD with nausea uncontrolled with pantoprazole and omeprazole.  Has been mostly following GERD diet.  Also under lots of stress/anxiety which may be contributing to symptoms as well.  We discussed side effects and when to stop  medication including high risk of suicidal and homicidal ideation.  Instructions provided on AVS in Spanish.  Zofran refilled, will continue to use as needed.  Advised to stop omeprazole for now.  May need further evaluation with pH impedance testing/manometry in the future if TCA not helpful.  Alternating constipation and diarrhea: Likely in the setting of recent cholecystectomy due to biliary dyskinesia.  Has occasional hard stools with need to strain.  Taking MiraLAX about every third day.  Advised for him to begin a stool softener daily to assist with better bowel movements and avoid excessive diarrhea.  Advised that he has any worsening constipation with taking amitriptyline to notify the office.  Advised he may increase MiraLAX to daily if needed.  We discussed patient's need for primary care provider today.  I provided him with a couple of options within [place].  PLAN   Start amitriptyline 10 mg nightly. Recommended to obtain a PCP Continue GERD diet.  Zofran 4 mg every 8 hours as needed, refill today. Stop omeprazole, may use famotidine 20 mg once or twice daily as needed.  Continue miralax 17g as needed, may increase to daily if needed. Start a Daily stool softener Follow up in 2-3 months    [name], MSN, FNP-BC, AGACNP-BC [facility]

## 2022-04-24 ENCOUNTER — Encounter: Payer: Self-pay | Admitting: Gastroenterology

## 2022-04-24 ENCOUNTER — Ambulatory Visit (INDEPENDENT_AMBULATORY_CARE_PROVIDER_SITE_OTHER): Payer: Self-pay | Admitting: Gastroenterology

## 2022-04-24 VITALS — BP 119/76 | HR 75 | Temp 98.1°F | Ht 67.0 in | Wt 195.0 lb

## 2022-04-24 DIAGNOSIS — G8929 Other chronic pain: Secondary | ICD-10-CM

## 2022-04-24 DIAGNOSIS — R198 Other specified symptoms and signs involving the digestive system and abdomen: Secondary | ICD-10-CM

## 2022-04-24 DIAGNOSIS — R1013 Epigastric pain: Secondary | ICD-10-CM

## 2022-04-24 DIAGNOSIS — R110 Nausea: Secondary | ICD-10-CM

## 2022-04-24 DIAGNOSIS — K219 Gastro-esophageal reflux disease without esophagitis: Secondary | ICD-10-CM

## 2022-04-24 MED ORDER — ONDANSETRON HCL 4 MG PO TABS: 4.0000 mg | ORAL_TABLET | Freq: Three times a day (TID) | ORAL | 1 refills | Status: AC | PRN

## 2022-04-24 MED ORDER — AMITRIPTYLINE HCL 10 MG PO TABS: 10.0000 mg | ORAL_TABLET | Freq: Every day | ORAL | 1 refills | Status: DC

## 2022-04-24 NOTE — Patient Instructions (Addendum)
Primary Care Offices Accepting New Patients: [facility]  [address], [place], [place] [address] [phone]  [facility] [address], [place], [place] [address] [phone]  For Dry Mouth: Can use biotene for dry mouth.  For your constipation: I want you to begin a daily stool softener like colace (docusate sodium).  May continue to use MiraLAX every 3 days.  Titrate as needed.  It is safe to take every day if needed. -You may stop MiraLAX if you begin to have significant diarrhea, would recommend you continue taking stool softener daily.  For your nausea/upper abdominal pain/reflux: -Please stop taking omeprazole 40 mg twice daily. -Please take famotidine 20 mg once or twice daily as needed for breakthrough. -We will trial amitriptyline 10 mg nightly. (Please monitor for any thoughts of harming others or yourself.  Also look out for inability to urinate, vision changes, worsening/significant dry mouth, or worsening constipation).  If any of these occur please notify the office and stop taking the medication.  This may also help you with stress and anxiety. -If amitriptyline is not helpful, we may need to consider esophageal manometry/pH impedance testing to see how to best treat you.   Spanish: Consultorios de atencin primaria que aceptan nuevos pacientes: medicina familiar de Linna Hoff  [address] [place], [place] [address] [phone]  atencin [facility] Angel [address], [place], [place] [address] [phone]  Para la boca seca: Naida Sleight bioteno para la boca seca.  Para tu estreimiento: Arelia Sneddon que comience a tomar un ablandador de heces diario como colace (docusato de sodio). Puede continuar usando MiraLAX cada 3 das. Valorar segn sea necesario. Es seguro tomarlo todos los das si es necesario. -Puede suspender MiraLAX si comienza a tener diarrea significativa; le recomendamos que contine tomando ablandador de heces  diariamente.  Para sus nuseas/dolor abdominal superior/reflujo: -Deje de tomar omeprazol 40 mg [place]. -Tome famotidina 20 mg [place] sea necesario para lograr un avance. -Probaremos 10 mg de amitriptilina todas las noches. (Est atento a cualquier pensamiento de daar a otros o a usted mismo. Tambin est atento a la incapacidad para orinar, cambios en la visin, empeoramiento o sequedad de boca significativa o empeoramiento del estreimiento). Si ocurre cualquiera de [place], notifique a la oficina y deje de Engineer, petroleum. Esto tambin puede ayudarle con el estrs y la ansiedad. -Si la amitriptilina no es til, es posible que debamos considerar la manometra esofgica/prueba de impedancia del pH para ver cul es Producer, television/film/video.   Seguimiento en 2-3 meses.  Fue un Oceanographer. Arelia Sneddon crear relaciones de confianza con los [place]. Si recibe [facility] su visita, le agradezco mucho que se tome el tiempo para completarla en papel o a travs de MyChart. Valoro tus comentarios.  Venetia Night, MSN, FNP-BC, AGACNP-BC [facility]

## 2022-04-25 ENCOUNTER — Encounter: Payer: Self-pay | Admitting: Gastroenterology

## 2022-05-19 ENCOUNTER — Ambulatory Visit: Payer: Self-pay | Admitting: Gastroenterology

## 2022-05-27 ENCOUNTER — Other Ambulatory Visit: Payer: Self-pay | Admitting: Gastroenterology

## 2022-06-02 ENCOUNTER — Other Ambulatory Visit: Payer: Self-pay | Admitting: Gastroenterology

## 2022-07-24 ENCOUNTER — Ambulatory Visit: Payer: Self-pay | Admitting: Gastroenterology

## 2022-07-24 ENCOUNTER — Encounter: Payer: Self-pay | Admitting: Gastroenterology

## 2022-07-24 NOTE — Progress Notes (Deleted)
GI Office Note    Referring Provider: No ref. provider found Primary Care Physician:  Pcp, No Primary Gastroenterologist: Elon Alas. Abbey Chatters, DO  Date:  07/24/2022  ID:  Keith Hale, DOB Oct 25, 1980, MRN [id number]   Chief Complaint   No chief complaint on file.   History of Present Illness  Keith Hale is a 42 y.o. male with a history of biliary dyskinesia s/p cholecystectomy in October 2023, H. pylori gastritis in 2020 s/p treatment and repeat endoscopic evaluation without H. pylori, GERD, constipation, chronic nausea presenting today with complaint of ***.  Patient has history of upper GI bleed in the setting of NSAID and ASA use with EGD in July 2020 findings duodenal ulcer with visible vessel and H. pylori gastritis.  Treated with clindamycin triple therapy with follow-up stool testing in November 2020 documenting eradication.  Began to have worsening epigastric discomfort in August 2022 with EGD revealing gastritis with biopsy with mild inflammation and no H. pylori.   CT in June 2020 negative and right upper quadrant ultrasound last year negative.  In April 2023 he was treated with PPI and Carafate 4 times daily.  HIDA scan offered which patient initially declined.   HIDA scan in May 2023 with reduced gallbladder EF of 16%.    Laparoscopic cholecystectomy 01/24/2022.  Noted to initially have improvement of nausea and abdominal pain however symptoms returned a few days after.   Office visit 02/27/2022 patient reported ongoing generalized abdominal pain and nausea that occurs every day. Advised to stop pantoprazole and start omeprazole 40 mg twice daily.  Given Zofran to use 4 mg 3 times daily as needed.  Also advised famotidine 20 mg once daily and to stop Carafate.  Per patient's request repeat EGD was scheduled.  Advised to complete pH impedance testing off PPI if not improved after switching from pantoprazole to omeprazole vs low dose TCA.    EGD 03/18/22: -Gastritis s/p  biopsy -Mucosal changes in the duodenum s/p biopsy -Advised omeprazole 40 mg twice daily -Gastric biopsy with features of reactive gastropathy, negative H. Pylori -Duodenal biopsy with polypoid fragment of benign small bowel mucosa with reactive changes (negative for dysplasia or malignancy)  Last office visit 04/24/22. ***  Today:    Current Outpatient Medications  Medication Sig Dispense Refill   amitriptyline (ELAVIL) 10 MG tablet TAKE 1 TABLET BY MOUTH AT BEDTIME. 30 tablet 0   Omega-3 Fatty Acids (FISH OIL OMEGA-3) 1000 MG CAPS Take by mouth.     ondansetron (ZOFRAN) 4 MG tablet Take 1 tablet (4 mg total) by mouth every 8 (eight) hours as needed. 15 tablet 1   No current facility-administered medications for this visit.    Past Medical History:  Diagnosis Date   GERD (gastroesophageal reflux disease)    GI hemorrhage 11/01/2018   duodenal ulcer with visible vessel   Helicobacter pylori gastritis 10/2018   treated with amoxicillin, clarithromycin, pantoprazole.  Follow-up stool test negative in November 2020.    Past Surgical History:  Procedure Laterality Date   APPENDECTOMY     BIOPSY  11/01/2018   Procedure: BIOPSY;  Surgeon: Danie Binder, MD;  [facility];  Service: Endoscopy;;   BIOPSY  12/07/2020   Procedure: BIOPSY;  Surgeon: Eloise Harman, DO;  [facility];  Service: Endoscopy;;   BIOPSY  03/18/2022   Procedure: BIOPSY;  Surgeon: Eloise Harman, DO;  [facility];  Service: Endoscopy;;   CHOLECYSTECTOMY N/A 01/24/2022  Procedure: LAPAROSCOPIC CHOLECYSTECTOMY;  Surgeon: Virl Cagey, MD;  [facility];  Service: General;  Laterality: N/A;   ESOPHAGOGASTRODUODENOSCOPY (EGD) WITH PROPOFOL N/A 11/01/2018   Surgeon: Danie Binder, MD;  duodenal ulcer with visible vessel and H. pylori gastritis   ESOPHAGOGASTRODUODENOSCOPY (EGD) WITH PROPOFOL N/A 12/07/2020   Surgeon: Eloise Harman, DO; gastritis with  biopsies revealing slight chronic inflammation and negative for H. pylori, otherwise normal exam.   ESOPHAGOGASTRODUODENOSCOPY (EGD) WITH PROPOFOL N/A 03/18/2022   Procedure: ESOPHAGOGASTRODUODENOSCOPY (EGD) WITH PROPOFOL;  Surgeon: Eloise Harman, DO;  [facility];  Service: Endoscopy;  Laterality: N/A;  1:00 pm, pt knows to arrive at 7:00   TONSILLECTOMY      Family History  Problem Relation Age of Onset   Hypertension Mother    Colon cancer Neg Hx     Allergies as of 07/24/2022   (No Known Allergies)    Social History   Socioeconomic History   Marital status: Married    Spouse name: Not on file   Number of children: Not on file   Years of education: Not on file   Highest education level: Not on file  Occupational History   Not on file  Tobacco Use   Smoking status: Former    Packs/day: 0.00    Years: 5.00    Additional pack years: 0.00    Total pack years: 0.00    Types: Cigarettes    Quit date: 04/21/2017    Years since quitting: 5.2    Passive exposure: Past   Smokeless tobacco: Never   Tobacco comments:    previously smoked about 2 cig/ month.  Vaping Use   Vaping Use: Never used  Substance and Sexual Activity   Alcohol use: Not Currently   Drug use: No   Sexual activity: Yes  Other Topics Concern   Not on file  Social History Narrative   Not on file   Social Determinants of Health   Financial Resource Strain: Not on file  Food Insecurity: Not on file  Transportation Needs: Not on file  Physical Activity: Not on file  Stress: Not on file  Social Connections: Not on file     Review of Systems   Gen: Denies fever, chills, anorexia. Denies fatigue, weakness, weight loss.  CV: Denies chest pain, palpitations, syncope, peripheral edema, and claudication. Resp: Denies dyspnea at rest, cough, wheezing, coughing up blood, and pleurisy. GI: See HPI Derm: Denies rash, itching, dry skin Psych: Denies depression, anxiety, memory loss,  confusion. No homicidal or suicidal ideation.  Heme: Denies bruising, bleeding, and enlarged lymph nodes.   Physical Exam   There were no vitals taken for this visit.  General:   Alert and oriented. No distress noted. Pleasant and cooperative.  Head:  Normocephalic and atraumatic. Eyes:  Conjuctiva clear without scleral icterus. Mouth:  Oral mucosa pink and moist. Good dentition. No lesions. Lungs:  Clear to auscultation bilaterally. No wheezes, rales, or rhonchi. No distress.  Heart:  S1, S2 present without murmurs appreciated.  Abdomen:  +BS, soft, non-tender and non-distended. No rebound or guarding. No HSM or masses noted. Rectal: *** Msk:  Symmetrical without gross deformities. Normal posture. Extremities:  Without edema. Neurologic:  Alert and  oriented x4 Psych:  Alert and cooperative. Normal mood and affect.   Assessment  Keith Hale is a 42 y.o. male with a history of biliary dyskinesia s/p cholecystectomy in October 2023, H. pylori gastritis in 2020 s/p treatment and repeat  endoscopic evaluation without H. pylori, GERD, constipation, chronic nausea presenting today for ***  GERD, abdominal pain, nausea:   Alternating constipation and diarrhea:   PLAN   ***     Venetia Night, MSN, FNP-BC, AGACNP-BC [facility]

## 2022-08-28 IMAGING — NM NM HEPATO W/GB/PHARM/[PERSON_NAME]
2 series · 12 of 12 positions shown · non-contrast
Comparison: Right upper quadrant ultrasound July 26, 2021

CLINICAL DATA: Postprandial epigastric abdominal pain.

EXAM:
NUCLEAR MEDICINE HEPATOBILIARY IMAGING WITH GALLBLADDER EF
TECHNIQUE: Sequential images of the abdomen were obtained [DATE] minutes
following intravenous administration of radiopharmaceutical. After
oral ingestion of Ensure, gallbladder ejection fraction was
determined. At 60 min, normal ejection fraction is greater than 33%.
RADIOPHARMACEUTICALS:  5.0 mCi Rc-ZZm  Choletec IV

[Series 1: biliary · 3.25mm/px · 6 of 55 frames shown]
[frame 5/55]
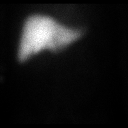
[frame 14/55]
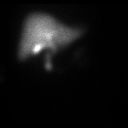
[frame 23/55]
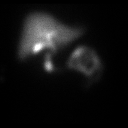
[frame 32/55]
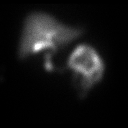
[frame 41/55]
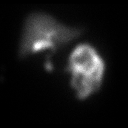
[frame 51/55]
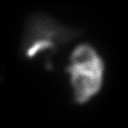

[Series 2: gbef · 3.25mm/px · 6 of 60 frames shown]
[frame 6/60]
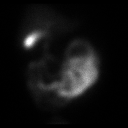
[frame 16/60]
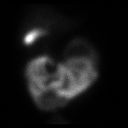
[frame 26/60]
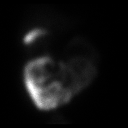
[frame 36/60]
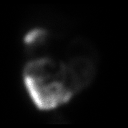
[frame 46/60]
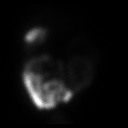
[frame 56/60]
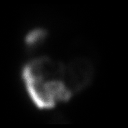

[12 of 12 positions shown; findings below may reference images not displayed]

FINDINGS: Prompt uptake and biliary excretion of activity by the liver is
seen. Gallbladder activity is visualized, consistent with patency of
cystic duct. Biliary activity passes into small bowel, consistent
with patent common bile duct.

Calculated gallbladder ejection fraction is 16%. (Normal gallbladder
ejection fraction with Ensure is greater than 33%.)
IMPRESSION: Reduced gallbladder ejection fraction as can be seen with chronic
cholecystitis/biliary dyskinesia.

## 2023-02-05 ENCOUNTER — Encounter (HOSPITAL_COMMUNITY): Payer: Self-pay

## 2023-02-05 ENCOUNTER — Other Ambulatory Visit: Payer: Self-pay

## 2023-02-05 ENCOUNTER — Emergency Department (HOSPITAL_COMMUNITY)
Admission: EM | Admit: 2023-02-05 | Discharge: 2023-02-06 | Disposition: A | Discharge status: Home / Self Care | Payer: 59 | Attending: Emergency Medicine | Admitting: Emergency Medicine

## 2023-02-05 DIAGNOSIS — H1132 Conjunctival hemorrhage, left eye: Secondary | ICD-10-CM | POA: Diagnosis not present

## 2023-02-05 DIAGNOSIS — W228XXA Striking against or struck by other objects, initial encounter: Secondary | ICD-10-CM | POA: Diagnosis not present

## 2023-02-05 DIAGNOSIS — H578A2 Foreign body sensation, left eye: Secondary | ICD-10-CM | POA: Diagnosis present

## 2023-02-05 MED ORDER — FLUORESCEIN SODIUM 1 MG OP STRP: 1.0000 | ORAL_STRIP | Freq: Once | OPHTHALMIC | Status: DC

## 2023-02-05 MED ORDER — TETRACAINE HCL 0.5 % OP SOLN
2.0000 [drp] | Freq: Once | OPHTHALMIC | Status: DC
  Filled 2023-02-05: qty 4

## 2023-02-05 MED ORDER — TETRACAINE HCL 0.5 % OP SOLN
1.0000 [drp] | Freq: Once | OPHTHALMIC | Status: AC
  Administered 2023-02-06: 1 [drp] via OPHTHALMIC

## 2023-02-05 MED ORDER — FLUORESCEIN SODIUM 1 MG OP STRP
1.0000 | ORAL_STRIP | Freq: Once | OPHTHALMIC | Status: AC
  Administered 2023-02-06: 1 via OPHTHALMIC
  Filled 2023-02-05: qty 1

## 2023-02-05 NOTE — ED Triage Notes (Addendum)
Pt had metal thrown into eye while working yesterday morning. Pt stated little pain right now. Able to answer my questions without interpreter.

## 2023-02-05 NOTE — ED Provider Triage Note (Signed)
Emergency Medicine Provider Triage Evaluation Note  TYREES CHOPIN , a 42 y.o. male  was evaluated in triage.  Pt complains of metal piece striking him in his eye   Review of Systems  Positive: Redness left eye Negative: Visual change  Physical Exam  BP 134/80 (BP Location: Right Arm)   Pulse 61   Temp 97.7 F (36.5 C)   Resp 15   Ht 5\' 8"  (1.727 m)   Wt 88.9 kg   SpO2 98%   BMI 29.80 kg/m  Gen:   Awake, no distress   Resp:  Normal effort  MSK:   Moves extremities without difficulty  Other:  Subconjunctival hemorrhage lateral left eye  Medical Decision Making  Medically screening exam initiated at 10:44 PM.  Appropriate orders placed.  LAZARIUS RIVKIN was informed that the remainder of the evaluation will be completed by another provider, this initial triage assessment does not replace that evaluation, and the importance of remaining in the ED until their evaluation is complete.     Elson Areas, [place] 02/05/23 [id number]

## 2023-02-06 ENCOUNTER — Emergency Department (HOSPITAL_COMMUNITY): Payer: 59

## 2023-02-06 NOTE — Discharge Instructions (Addendum)
There is no sign of any significant injury to your eye.

## 2023-02-06 NOTE — ED Notes (Signed)
Checked on pt, no complaints or needs at this time.  Informed pt that CT is still pending.

## 2023-02-06 NOTE — ED Provider Notes (Signed)
[place] EMERGENCY DEPARTMENT AT [facility] Provider Note   CSN: [id number] Arrival date & time: 02/05/23  1953     History  Chief Complaint  Patient presents with   Eye Injury    Keith Hale is a 42 y.o. male.  The history is provided by the patient.  Eye Injury  He was drilling some metal yesterday morning when a piece flew into his left eye.  He has a foreign body sensation in the lateral aspect of the eye.  Denies any blurred vision.   Home Medications Prior to Admission medications   Medication Sig Start Date End Date Taking? Authorizing Provider  amitriptyline (ELAVIL) 10 MG tablet TAKE 1 TABLET BY MOUTH AT BEDTIME. 05/27/22   Mahon, Frederik Schmidt, NP  Omega-3 Fatty Acids (FISH OIL OMEGA-3) 1000 MG CAPS Take by mouth.    [provider]  ondansetron (ZOFRAN) 4 MG tablet Take 1 tablet (4 mg total) by mouth every 8 (eight) hours as needed. 04/24/22   Aida Raider, NP      Allergies    Patient has no known allergies.    Review of Systems   Review of Systems  All other systems reviewed and are negative.   Physical Exam Updated Vital Signs BP 134/80 (BP Location: Right Arm)   Pulse 61   Temp 97.7 F (36.5 C)   Resp 15   Ht 5\' 8"  (1.727 m)   Wt 88.9 kg   SpO2 98%   BMI 29.80 kg/m  Physical Exam Vitals and nursing note reviewed.   42 year old male, resting comfortably and in no acute distress. Vital signs are normal. Oxygen saturation is 98%, which is normal. Head is normocephalic and atraumatic. PERRLA, EOMI. No corneal foreign body seen.  Anterior chamber is clear.  There is mild injection of the left cornea with what appears to be a localized subconjunctival hemorrhage lateral to the limbus.  I was evaluated with ultraviolet light following staining with fluorescein and no abnormal areas of uptake were seen. Lungs are clear without rales, wheezes, or rhonchi. Chest is nontender. Heart has regular rate and rhythm without  murmur. Abdomen is soft, flat, nontender.        ED Results / Procedures / Treatments    Radiology CT Orbits Wo Contrast  Result Date: 02/06/2023 CLINICAL DATA:  Possible metallic foreign body in the right eye. EXAM: CT ORBITS WITHOUT CONTRAST TECHNIQUE: Multidetector CT imaging of the orbits was performed using the standard protocol without intravenous contrast. Multiplanar CT image reconstructions were also generated. RADIATION DOSE REDUCTION: This exam was performed according to the departmental dose-optimization program which includes automated exposure control, adjustment of the mA and/or kV according to patient size and/or use of iterative reconstruction technique. COMPARISON:  12/10/2014 head CT FINDINGS: Orbits: No orbital mass, injury or evidence of inflammation. No metallic foreign body. Visible paranasal sinuses: Clear. Soft tissues: No visible injury Osseous: Negative Limited intracranial: Negative IMPRESSION: Negative CT of the orbits.  No foreign body seen. Electronically Signed   By: Tiburcio Pea M.D.   On: 02/06/2023 05:36    Procedures Procedures    Medications Ordered in ED Medications  tetracaine (PONTOCAINE) 0.5 % ophthalmic solution 2 drop (has no administration in time range)  fluorescein ophthalmic strip 1 strip (has no administration in time range)  tetracaine (PONTOCAINE) 0.5 % ophthalmic solution 1 drop (has no administration in time range)  fluorescein ophthalmic strip 1 strip (has no administration in time range)  ED Course/ Medical Decision Making/ A&P                                 Medical Decision Making Amount and/or Complexity of Data Reviewed Radiology: ordered.  Risk Prescription drug management.   Injury to left eye with no foreign body seen.  However, injury was noted to the lateral aspect of the globe, I have ordered CT scan to rule out intraocular foreign body.  Visual acuity is 20/30 bilaterally.  CT scan shows no evidence of  any injury to the globe, no intraocular foreign body.  He appears to have a traumatic subconjunctival hemorrhage, no evidence of other injury.  He is safe for discharge.  Final Clinical Impression(s) / ED Diagnoses Final diagnoses:  Subconjunctival hematoma, left    Rx / DC Orders ED Discharge Orders     None         Dione Booze, MD 02/06/23 [phone]
